# Patient Record
Sex: Male | Born: 1980 | Hispanic: Yes | Smoking: Never smoker
Health system: Southern US, Community
[De-identification: ages and names within clinical notes are randomized; demographics above are authoritative.]

## PROBLEM LIST (undated history)

## (undated) DIAGNOSIS — G510 Bell's palsy: Secondary | ICD-10-CM

## (undated) HISTORY — DX: Bell's palsy: G51.0

---

## 2012-10-12 ENCOUNTER — Ambulatory Visit (INDEPENDENT_AMBULATORY_CARE_PROVIDER_SITE_OTHER): Payer: BC Managed Care – PPO | Admitting: Emergency Medicine

## 2012-10-12 VITALS — BP 132/82 | HR 52 | Temp 98.0°F | Resp 18 | Ht 62.0 in | Wt 167.0 lb

## 2012-10-12 DIAGNOSIS — H1130 Conjunctival hemorrhage, unspecified eye: Secondary | ICD-10-CM

## 2012-10-12 DIAGNOSIS — H1131 Conjunctival hemorrhage, right eye: Secondary | ICD-10-CM

## 2012-10-12 DIAGNOSIS — B351 Tinea unguium: Secondary | ICD-10-CM

## 2012-10-12 MED ORDER — OFLOXACIN 0.3 % OP SOLN: 1.0000 [drp] | Freq: Four times a day (QID) | OPHTHALMIC | Status: DC

## 2012-10-12 MED ORDER — TERBINAFINE HCL 250 MG PO TABS: 250.0000 mg | ORAL_TABLET | Freq: Every day | ORAL | Status: DC

## 2012-10-12 NOTE — Patient Instructions (Addendum)
Ringworm, Nail A fungal infection of the nail (tinea unguium/onychomycosis) is common. It is common as the visible part of the nail is composed of dead cells which have no blood supply to help prevent infection. It occurs because fungi are everywhere and will pick any opportunity to grow on any dead material. Because nails are very slow growing they require up to 2 years of treatment with anti-fungal medications. The entire nail back to the base is infected. This includes approximately  of the nail which you cannot see. If your caregiver has prescribed a medication by mouth, take it every day and as directed. No progress will be seen for at least 6 to 9 months. Do not be disappointed! Because fungi live on dead cells with little or no exposure to blood supply, medication delivery to the infection is slow; thus the cure is slow. It is also why you can observe no progress in the first 6 months. The nail becoming cured is the base of the nail, as it has the blood supply. Topical medication such as creams and ointments are usually not effective. Important in successful treatment of nail fungus is closely following the medication regimen that your doctor prescribes. Sometimes you and your caregiver may elect to speed up this process by surgical removal of all the nails. Even this may still require 6 to 9 months of additional oral medications. See your caregiver as directed. Remember there will be no visible improvement for at least 6 months. See your caregiver sooner if other signs of infection (redness and swelling) develop. Document Released: 04/26/2000 Document Revised: 07/22/2011 Document Reviewed: 07/05/2008 ExitCare Patient Information 2014 [employer].  

## 2012-10-12 NOTE — Progress Notes (Signed)
[facility] [address] [place] [address] [phone]  Date:  10/12/2012   Name:  Christian Collins   DOB:  1981-04-25   MRN:  [id number]  PCP:  No PCP Per Patient    Chief Complaint: Eye Problem and Nail Problem   History of Present Illness:  Christian Collins is a 32 y.o. very pleasant male patient who presents with the following:  Injured while cutting metal and has a foreign body sensation in right eye and associated pain.  Accident occurred Wednesday.  No improvement with over the counter medications or other home remedies. Has deformity and pain in both great toenails.  No history of injury.   Denies other complaint or health concern today.   There are no active problems to display for this patient.   History reviewed. No pertinent past medical history.  History reviewed. No pertinent past surgical history.  History  Substance Use Topics  . Smoking status: Never Smoker   . Smokeless tobacco: Not on file  . Alcohol Use: Yes    History reviewed. No pertinent family history.  No Known Allergies  Medication list has been reviewed and updated.  No current outpatient prescriptions on file prior to visit.   No current facility-administered medications on file prior to visit.    Review of Systems:  As per HPI, otherwise negative.    Physical Examination: Filed Vitals:   10/12/12 1558  BP: 132/82  Pulse: 52  Temp: 98 F (36.7 C)  Resp: 18   Filed Vitals:   10/12/12 1558  Height: 5\' 2"  (1.575 m)  Weight: 167 lb (75.751 kg)   Body mass index is 30.54 kg/(m^2). Ideal Body Weight: Weight in (lb) to have BMI = 25: 136.4   GEN: WDWN, NAD, Non-toxic, Alert & Oriented x 3 HEENT: Atraumatic, Normocephalic.  Ears and Nose: No external deformity. EXTR: No clubbing/cyanosis/edema NEURO: Normal gait.  PSYCH: Normally interactive. Conversant. Not depressed or anxious appearing.  Calm demeanor.  RIGHT Eye:  PRRERLA EOMI no foreign body.   Has small lateral conjunctival hemorrhage. No fluorescein uptake. Feet:  Onychomycosis great toe nails   Assessment and Plan: Conjunctival hemorrhage Ocuflox Onychomycosis lamisil To follow up monthly for LFT's   Signed,  Phillips Odor, MD

## 2014-08-23 ENCOUNTER — Ambulatory Visit (INDEPENDENT_AMBULATORY_CARE_PROVIDER_SITE_OTHER): Payer: BLUE CROSS/BLUE SHIELD | Admitting: Urgent Care

## 2014-08-23 ENCOUNTER — Telehealth: Payer: Self-pay | Admitting: *Deleted

## 2014-08-23 VITALS — BP 130/82 | HR 62 | Temp 98.0°F | Resp 17 | Ht 62.25 in | Wt 175.2 lb

## 2014-08-23 DIAGNOSIS — R29810 Facial weakness: Secondary | ICD-10-CM | POA: Diagnosis not present

## 2014-08-23 DIAGNOSIS — G510 Bell's palsy: Secondary | ICD-10-CM

## 2014-08-23 DIAGNOSIS — H02402 Unspecified ptosis of left eyelid: Secondary | ICD-10-CM

## 2014-08-23 DIAGNOSIS — R519 Headache, unspecified: Secondary | ICD-10-CM

## 2014-08-23 DIAGNOSIS — R51 Headache: Secondary | ICD-10-CM

## 2014-08-23 DIAGNOSIS — B353 Tinea pedis: Secondary | ICD-10-CM

## 2014-08-23 LAB — POCT CBC
GRANULOCYTE PERCENT: 68.8 % (ref 37–80)
HCT, POC: 46.3 % (ref 43.5–53.7)
HEMOGLOBIN: 15.3 g/dL (ref 14.1–18.1)
Lymph, poc: 1.2 (ref 0.6–3.4)
MCH, POC: 27.9 pg (ref 27–31.2)
MCHC: 33 g/dL (ref 31.8–35.4)
MCV: 84.8 fL (ref 80–97)
MID (cbc): 0.3 (ref 0–0.9)
MPV: 7.4 fL (ref 0–99.8)
POC GRANULOCYTE: 3.3 (ref 2–6.9)
POC LYMPH PERCENT: 25 %L (ref 10–50)
POC MID %: 6.2 % (ref 0–12)
Platelet Count, POC: 227 10*3/uL (ref 142–424)
RBC: 5.46 M/uL (ref 4.69–6.13)
RDW, POC: 12.2 %
WBC: 4.8 10*3/uL (ref 4.6–10.2)

## 2014-08-23 LAB — GLUCOSE, POCT (MANUAL RESULT ENTRY): POC GLUCOSE: 84 mg/dL (ref 70–99)

## 2014-08-23 MED ORDER — PREDNISONE 20 MG PO TABS
20.0000 mg | ORAL_TABLET | Freq: Every day | ORAL | Status: DC
Start: 2014-08-23 — End: 2016-09-11

## 2014-08-23 MED ORDER — TERBINAFINE HCL 250 MG PO TABS: 250.0000 mg | ORAL_TABLET | Freq: Every day | ORAL | Status: DC

## 2014-08-23 NOTE — Telephone Encounter (Signed)
Pt was called and advised/instructed on where to go for his 10 am CT [facility]  Pt was advised to report to the main entrance and ask for Radiology.  Pt understood and was also advised to call [facility] if he had any issues or problems.

## 2014-08-23 NOTE — Progress Notes (Signed)
MRN: [id number] DOB: Aug 21, 1980  Subjective:   Christian Collins is a 34 y.o. male presenting for chief complaint of Facial Swelling and Numbness  Reports 3 day history of left sided scalp and facial pain lasting ~5 minutes, followed by drooping, numbness of same side. Patient also developed right-sided facial pain and numbness but without the drooping yesterday. Today reports ongoing left eye pain/discomfort, photophobia, eye twitching, eye drooping, neck stiffness, feels like his throat has been closing when he lies down, thinks it becomes inflamed and has transient difficulty breathing. Has tried Neurobion (Vitamin B) without any relief. Denies fevers, slurred speech, headache, confusion, disorientation, rashes, ear pain, ear drainage, eye drainage, cough, chest pain, shob, wheezing, n/v, abdominal pain, incontinence. Denies recent respiratory illness. States that he is generally healthy. Of note, he states his mother has history of diabetes, has had similar episode of facial drooping but is unclear about what her diagnosis and treatment was. Denies smoking, has occasional alcohol drink.   Foot issue - reports history of athlete's foot. States that he now has this problem in his left foot over his left big toe, states it's very itchy and chipping away at his nail. Denies fevers, drainage of pus or blood, erythema, joint pain. Has previously tried oral Lamisil with significant improvement in athlete's foot in right foot.   Denies any other aggravating or relieving factors, no other questions or concerns.  Daquon has a current medication list which includes the following prescription(s): ofloxacin and terbinafine. He has No Known Allergies.  Frederic  has no past medical history on file. Also  has no past surgical history on file.  ROS As in subjective.  Objective:   Vitals: BP 130/82 mmHg  Pulse 62  Temp(Src) 98 F (36.7 C) (Oral)  Resp 17  Ht 5' 2.25" (1.581 m)  Wt 175 lb 3.2 oz  (79.47 kg)  BMI 31.79 kg/m2  SpO2 98%  Physical Exam  Constitutional: He is oriented to person, place, and time and well-developed, well-nourished, and in no distress.  HENT:  TM's intact bilaterally, no effusions or erythema. Nares patent, nasal turbinates pink and moist. No sinus tenderness. Oropharynx clear, mucous membranes moist, dentition in good repair. Left-sided mouth with slight droop.  Eyes: Conjunctivae and EOM are normal. Pupils are equal, round, and reactive to light. Right eye exhibits no discharge. Left eye exhibits no discharge. No scleral icterus.  Left eyelid slightly closed and drooping.  Neck: No thyromegaly present.  Negative Kernig and Brudzinski.  Cardiovascular: Normal rate, regular rhythm and intact distal pulses.  Exam reveals no gallop and no friction rub.   No murmur heard. Pulmonary/Chest: No stridor. No respiratory distress. He has no wheezes. He has no rales. He exhibits no tenderness.  Abdominal: Soft. Bowel sounds are normal. He exhibits no distension and no mass. There is no tenderness.  Musculoskeletal: Normal range of motion. He exhibits no edema or tenderness.  Lymphadenopathy:    He has no cervical adenopathy.  Neurological: He is alert and oriented to person, place, and time. He has normal reflexes. A cranial nerve deficit (slight weakness in right eyelid closure, all other CN without deficit) is present.  Skin: Skin is warm and dry. Rash (first left toe with ) noted. No erythema. No pallor.   Results for orders placed or performed in visit on 08/23/14 (from the past 24 hour(s))  POCT CBC     Status: None   Collection Time: 08/23/14  6:11 PM  Result Value Ref Range  WBC 4.8 4.6 - 10.2 K/uL   Lymph, poc 1.2 0.6 - 3.4   POC LYMPH PERCENT 25.0 10 - 50 %L   MID (cbc) 0.3 0 - 0.9   POC MID % 6.2 0 - 12 %M   POC Granulocyte 3.3 2 - 6.9   Granulocyte percent 68.8 37 - 80 %G   RBC 5.46 4.69 - 6.13 M/uL   Hemoglobin 15.3 14.1 - 18.1 g/dL   HCT,  POC 40.9 81.1 - 53.7 %   MCV 84.8 80 - 97 fL   MCH, POC 27.9 27 - 31.2 pg   MCHC 33.0 31.8 - 35.4 g/dL   RDW, POC 91.4 %   Platelet Count, POC 227 142 - 424 K/uL   MPV 7.4 0 - 99.8 fL  POCT glucose (manual entry)     Status: None   Collection Time: 08/23/14  6:11 PM  Result Value Ref Range   POC Glucose 84 70 - 99 mg/dl   Assessment and Plan :   1. Facial pain 2. Drooping eyelid, left 3. Drooping of mouth 4. Bell's palsy - Start prednisone taper, reviewed this with patient, will schedule non-contrast head CT to evaluate for possible mass. Follow up with head CT results thereafter. Recommended recheck in 1 month.  5. Tinea pedis of left foot - Start oral lamisil, advised patient to return in 30 days for repeat   Wallis Bamberg, PA-C [facility] [phone] 08/23/2014 5:16 PM

## 2014-08-23 NOTE — Patient Instructions (Signed)
Parálisis de Bell °(Bell's Palsy) °La parálisis de Bell es un trastorno en el que los músculos de un lado de la cara no pueden moverse (parálisis). Esto se debe a que los nervios de la cara están paralizados. Se cree que es producido por un virus. El virus causa inflamación del nervio que controla los movimientos de un lado de la cara. El nervio pasa a través de un espacio angosto, rodeado de hueso. Cuando el nervio se inflama, el hueso puede comprimirlo. Esto da como resultado un daño a la cubierta protectora que rodea el nervio. Este daño interfiere con la forma en la que el nervio se comunica con los músculos de la cara. Como resultado, puede causar debilidad o parálisis en los músculos faciales.  °Las lesiones (traumatismos), tumores y cirugía pueden causar parálisis de Bell, pero la mayoría de las veces se desconoce la causa. Es un trastorno bastante frecuente. Comienza de repente (aparición abrupta) y la parálisis por lo general desaparece en 2 días. La parálisis de Bell no es peligrosa. Pero como el ojo no se cierra adecuadamente, necesitará tomar algunos recaudos para que no se seque. Esto puede incluir un vendaje (mantener el ojo cerrado) o humedecerlo con lágrimas artificiales. Rara vez ocurre en ambos lados al mismo tiempo. °SÍNTOMAS °· Ceja caída. °· Caída del ojo y comisura de la boca. °· Incapacidad para cerrar un ojo. °· Pérdida del sentido del gusto en la parte anterior de la lengua. °· Sensibilidad a ruidos fuertes. °TRATAMIENTO °El tratamiento por lo general no es quirúrgico. Si el paciente fue atendido dentro de las primeras 24 a 48 horas puede que se le haya prescrito un tratamiento corto con esteroides para intentar acortar el curso de la enfermedad. También se podrán utilizar medicamentos antivirales junto con los esteroides, pero no está claro si son útiles.  °Necesitará proteger el ojo si no puede cerrarlo. La córnea (cubierta transparente del ojo) podría secarse y dañarse. Se podrán utilizar  lágrimas artificiales para mantener el ojo lubricado. Se deberán utilizar lentes o parches para proteger el ojo. °PRONÓSTICO °El tiempo de recuperación es variable y puede tardar desde algunos días a varios meses. Aunque en general se cura completamente, (en un 80% de los casos aproximadamente), las consecuencias no pueden predecirse. La mayoría de las personas mejoran dentro de las 3 semanas del comienzo de los síntomas. Las mejoras deberán continuar por entre 3 y 6 meses. Una pequeña cantidad de personas presentan debilidad moderada a grave que es permanente.  °INSTRUCCIONES PARA EL CUIDADO DOMICILIARIO °· Si su médico le prescribe medicamentos para controlar la inflamación del nervio, tómela de la manera indicada. No suspenda los medicamentos a menos que se lo haya indicado el profesional que le asiste. °· Utilice gotas oculares para humedecer los ojos y prevenir la sequedad, de la manera en que se le indique. °· Proteja sus ojos de la manera en que se lo indique el profesional que le asiste. °· Practique masajes faciales y ejercicios de la manera que se lo indique el profesional que le asiste. °· Realice sus actividades normales y procure un descanso regular. °SOLICITE ATENCIÓN MÉDICA DE INMEDIATO SI: °· Presenta dolor, enrojecimiento o irritación en el ojo. °· Usted o su niño tienen una temperatura oral de más de 38,9° C (102° F) y no puede controlarla con medicamentos. °ESTÉ SEGURO QUE:  °· Comprende las instrucciones para el alta médica. °· Controlará su enfermedad. °· Solicitará atención médica de inmediato según las indicaciones. °Document Released: 04/29/2005 Document Revised: 07/22/2011 °ExitCare® Patient   Information ©2015 [employer]. This information is not intended to replace advice given to you by your health care provider. Make sure you discuss any questions you have with your health care provider. ° °

## 2014-08-24 ENCOUNTER — Encounter (HOSPITAL_COMMUNITY): Payer: Self-pay

## 2014-08-24 ENCOUNTER — Ambulatory Visit (HOSPITAL_COMMUNITY)
Admission: RE | Admit: 2014-08-24 | Discharge: 2014-08-24 | Disposition: A | Discharge status: Home / Self Care | Payer: BLUE CROSS/BLUE SHIELD | Source: Ambulatory Visit | Attending: Urgent Care | Admitting: Urgent Care

## 2014-08-24 DIAGNOSIS — G510 Bell's palsy: Secondary | ICD-10-CM

## 2014-08-24 DIAGNOSIS — R51 Headache: Secondary | ICD-10-CM | POA: Insufficient documentation

## 2014-08-24 DIAGNOSIS — R519 Headache, unspecified: Secondary | ICD-10-CM

## 2014-08-24 DIAGNOSIS — H02402 Unspecified ptosis of left eyelid: Secondary | ICD-10-CM

## 2014-08-24 DIAGNOSIS — R29810 Facial weakness: Secondary | ICD-10-CM

## 2014-08-24 LAB — COMPREHENSIVE METABOLIC PANEL
ALT: 47 U/L (ref 0–53)
AST: 26 U/L (ref 0–37)
Albumin: 4.4 g/dL (ref 3.5–5.2)
Alkaline Phosphatase: 80 U/L (ref 39–117)
BUN: 10 mg/dL (ref 6–23)
CALCIUM: 9 mg/dL (ref 8.4–10.5)
CHLORIDE: 106 meq/L (ref 96–112)
CO2: 26 mEq/L (ref 19–32)
CREATININE: 0.66 mg/dL (ref 0.50–1.35)
Glucose, Bld: 86 mg/dL (ref 70–99)
POTASSIUM: 3.9 meq/L (ref 3.5–5.3)
Sodium: 142 mEq/L (ref 135–145)
Total Bilirubin: 0.6 mg/dL (ref 0.2–1.2)
Total Protein: 6.8 g/dL (ref 6.0–8.3)

## 2014-08-25 NOTE — Telephone Encounter (Signed)
Called patient at [phone] to report his results. Head CT and Cmet were normal. He states that he feels much better. Advised to continue current treatment plan. Return to clinic in 1 month.  Wallis BambergMario Domnique Vanegas, PA-C [facility] [phone] 08/25/2014  10:09 AM

## 2016-09-11 ENCOUNTER — Ambulatory Visit (INDEPENDENT_AMBULATORY_CARE_PROVIDER_SITE_OTHER): Payer: BLUE CROSS/BLUE SHIELD | Admitting: Urgent Care

## 2016-09-11 ENCOUNTER — Encounter: Payer: Self-pay | Admitting: Urgent Care

## 2016-09-11 VITALS — BP 144/82 | HR 83 | Temp 98.1°F | Resp 17 | Ht 61.5 in | Wt 168.0 lb

## 2016-09-11 DIAGNOSIS — R142 Eructation: Secondary | ICD-10-CM | POA: Diagnosis not present

## 2016-09-11 DIAGNOSIS — R0789 Other chest pain: Secondary | ICD-10-CM | POA: Diagnosis not present

## 2016-09-11 MED ORDER — ESOMEPRAZOLE MAGNESIUM 20 MG PO CPDR: 20.0000 mg | DELAYED_RELEASE_CAPSULE | Freq: Every day | ORAL | 2 refills | Status: AC

## 2016-09-11 MED ORDER — RANITIDINE HCL 150 MG PO TABS: 150.0000 mg | ORAL_TABLET | Freq: Two times a day (BID) | ORAL | 0 refills | Status: AC

## 2016-09-11 NOTE — Progress Notes (Addendum)
    MRN: [id number] DOB: Sep 14, 1980  Subjective:   Christian Collins is a 36 y.o. male presenting for follow up on Bell's palsy. Last OV was 08/23/2014. Head CT was negative at the time. Patient was placed on short steroid course and recommended to follow up in 1 month following his visit. Today, patient reports significant improvement in his symptoms. However, he has had 2 month history of intermittent left-sided sharp chest pain. His chest pain is transient, non-radiating, not associated with food. Denies fever, heart racing, palpitations, shob, diaphoresis, does not go into neck, jaw or limb. Denies smoking cigarettes. Drinks 1 alcohol drink per week. Drinks [employer] 4 times per week, has 3 cups of coffee per week. Drinks 48 ounces of water daily. Patient does not eat healthily, eats a lot of fast food, greasy and fried foods. Does not eat much fiber.  Arvo is not currently taking any medications. Also has No Known Allergies. [name]  has a past medical history of Bell's palsy. Also denies past surgical history. Family history is positive for diabetes with his mother.  Objective:   Vitals: BP (!) 144/82 (BP Location: Right Arm, Patient Position: Sitting, Cuff Size: Normal)   Pulse 83   Temp 98.1 F (36.7 C) (Oral)   Resp 17   Ht 5' 1.5" (1.562 m)   Wt 168 lb (76.2 kg)   SpO2 98%   BMI 31.23 kg/m   Physical Exam  Constitutional: He is oriented to person, place, and time. He appears well-developed and well-nourished.  HENT:  Mouth/Throat: Oropharynx is clear and moist.  Eyes: No scleral icterus.  Neck: Normal range of motion. Neck supple.  Cardiovascular: Normal rate, regular rhythm and intact distal pulses.  Exam reveals no gallop and no friction rub.   No murmur heard. Pulmonary/Chest: No respiratory distress. He has no wheezes. He has no rales.  Abdominal: Soft. Bowel sounds are normal. He exhibits no distension and no mass. There is no tenderness. There is no guarding.    Neurological: He is alert and oriented to person, place, and time.  Skin: Skin is warm and dry.   ECG interpretation - normal sinus rhythm at 62bpm.  Assessment and Plan :   1. Atypical chest pain 2. Burping - Labs pending, will start management for GERD. Recheck in 4 weeks.  Wallis Bamberg, PA-C [facility] [phone] 09/11/2016 3:45 PM

## 2016-09-11 NOTE — Patient Instructions (Addendum)
Opciones de alimentos para pacientes con reflujo gastroesofgico - Adultos (Food Choices for Gastroesophageal Reflux Disease, Adult) Cuando se tiene reflujo gastroesofgico (ERGE), los alimentos que se ingieren y los hbitos de alimentacin son muy importantes. Elegir los alimentos adecuados puede ayudar a aliviar las molestias ocasionadas por el ERGE. QU PAUTAS GENERALES DEBO SEGUIR?  Elija las frutas, los vegetales, los cereales integrales, los productos lcteos, la carne de vaca, de pescado y de ave con bajo contenido de grasas.  Limite las grasas, como los aceites, los aderezos para ensalada, la manteca, los frutos secos y el aguacate.  Lleve un registro de las comidas para identificar los alimentos que ocasionan sntomas.  Evite los alimentos que le ocasionen reflujo. Pueden ser distintos para cada persona.  Haga comidas pequeas con frecuencia en lugar de tres comidas abundantes todos los das.  Coma lentamente, en un clima distendido.  Limite el consumo de alimentos fritos.  Cocine los alimentos utilizando mtodos que no sean la fritura.  Evite el consumo alcohol.  Evite beber grandes cantidades de lquidos con las comidas.  Evite agacharse o recostarse hasta despus de 2 o 3horas de haber comido. QU ALIMENTOS NO SE RECOMIENDAN? Los siguientes son algunos alimentos y bebidas que pueden empeorar los sntomas: Vegetales  Tomates. Jugo de tomate. Salsa de tomate y espagueti. Ajes. Cebolla y ajo. Rbano picante. Frutas  Naranjas, pomelos y limn (fruta y jugo). Carnes  Carnes de vaca, de pescado y de ave con gran contenido de grasas. Esto incluye los perros calientes, las costillas, el jamn, la salchicha, el salame y el tocino. Lcteos  Leche entera y leche chocolatada. Crema cida. Crema. Mantequilla. Helados. Queso crema. Bebidas  Caf y t negro, con o sin cafena Bebidas gaseosas o energizantes. Condimentos  Salsa picante. Salsa barbacoa. Dulces/postres   Chocolate y cacao. Rosquillas. Menta y mentol. Grasas y aceites  Alimentos con alto contenido de grasas, incluidas las papas fritas. Otros  Vinagre. Especias picantes, como la pimienta negra, la pimienta blanca, la pimienta roja, la pimienta de cayena, el curry en polvo, los clavos de olor, el jengibre y el chile en polvo. Los artculos mencionados arriba pueden no ser una lista completa de las bebidas y los alimentos que se deben evitar. Comunquese con el nutricionista para recibir ms informacin.  Esta informacin no tiene como fin reemplazar el consejo del mdico. Asegrese de hacerle al mdico cualquier pregunta que tenga. Document Released: 02/06/2005 Document Revised: 05/20/2014 Document Reviewed: 03/03/2013 Elsevier Interactive Patient Education  2017 Elsevier Inc.     IF you received an x-ray today, you will receive an invoice from [facility]. Please contact [facility] at [phone] with questions or concerns regarding your invoice.   IF you received labwork today, you will receive an invoice from [facility]. Please contact [facility] at [phone] with questions or concerns regarding your invoice.   Our billing staff will not be able to assist you with questions regarding bills from these companies.  You will be contacted with the lab results as soon as they are available. The fastest way to get your results is to activate your My Chart account. Instructions are located on the last page of this paperwork. If you have not heard from us regarding the results in 2 weeks, please contact this office.      

## 2016-09-12 ENCOUNTER — Other Ambulatory Visit: Payer: Self-pay | Admitting: Urgent Care

## 2016-09-12 DIAGNOSIS — A048 Other specified bacterial intestinal infections: Secondary | ICD-10-CM

## 2016-09-12 LAB — COMPREHENSIVE METABOLIC PANEL
ALBUMIN: 5.1 g/dL (ref 3.5–5.5)
ALT: 59 IU/L — ABNORMAL HIGH (ref 0–44)
AST: 33 IU/L (ref 0–40)
Albumin/Globulin Ratio: 2 (ref 1.2–2.2)
Alkaline Phosphatase: 78 IU/L (ref 39–117)
BUN / CREAT RATIO: 14 (ref 9–20)
BUN: 13 mg/dL (ref 6–20)
Bilirubin Total: 0.8 mg/dL (ref 0.0–1.2)
CALCIUM: 9.8 mg/dL (ref 8.7–10.2)
CO2: 25 mmol/L (ref 18–29)
CREATININE: 0.92 mg/dL (ref 0.76–1.27)
Chloride: 99 mmol/L (ref 96–106)
GFR, EST AFRICAN AMERICAN: 125 mL/min/{1.73_m2} (ref >59)
GFR, EST NON AFRICAN AMERICAN: 108 mL/min/{1.73_m2} (ref >59)
GLUCOSE: 75 mg/dL (ref 65–99)
Globulin, Total: 2.5 g/dL (ref 1.5–4.5)
Potassium: 3.9 mmol/L (ref 3.5–5.2)
Sodium: 139 mmol/L (ref 134–144)
TOTAL PROTEIN: 7.6 g/dL (ref 6.0–8.5)

## 2016-09-12 LAB — CBC
Hematocrit: 47.1 % (ref 37.5–51.0)
Hemoglobin: 16.5 g/dL (ref 13.0–17.7)
MCH: 29 pg (ref 26.6–33.0)
MCHC: 35 g/dL (ref 31.5–35.7)
MCV: 83 fL (ref 79–97)
Platelets: 194 10*3/uL (ref 150–379)
RBC: 5.68 x10E6/uL (ref 4.14–5.80)
RDW: 13.5 % (ref 12.3–15.4)
WBC: 5.4 10*3/uL (ref 3.4–10.8)

## 2016-09-12 LAB — LIPID PANEL
CHOLESTEROL TOTAL: 220 mg/dL — AB (ref 100–199)
Chol/HDL Ratio: 4.2 ratio (ref 0.0–5.0)
HDL: 52 mg/dL (ref >39)
LDL CALC: 134 mg/dL — AB (ref 0–99)
Triglycerides: 171 mg/dL — ABNORMAL HIGH (ref 0–149)
VLDL CHOLESTEROL CAL: 34 mg/dL (ref 5–40)

## 2016-09-12 LAB — H. PYLORI BREATH TEST: H. PYLORI UBIT: POSITIVE — AB

## 2016-09-12 MED ORDER — AMOXICILLIN 500 MG PO CAPS: 1000.0000 mg | ORAL_CAPSULE | Freq: Two times a day (BID) | ORAL | 0 refills | Status: DC

## 2016-09-12 MED ORDER — CLARITHROMYCIN ER 500 MG PO TB24: 1000.0000 mg | ORAL_TABLET | Freq: Every day | ORAL | 0 refills | Status: AC

## 2016-09-30 IMAGING — CT CT HEAD W/O CM
1 series · 16 of 30 positions shown, 20 images · non-contrast
Comparison: None.

CLINICAL DATA: Left facial tightness, left high true, right facial
numbness

EXAM:
CT HEAD WITHOUT CONTRAST
TECHNIQUE: Contiguous axial images were obtained from the base of the skull
through the vertex without intravenous contrast.

[Series 2: headseq 4.8 h45s · axial · 0.43mm/px · z∈[-78,+53]mm · 16 of 30 slices shown, 20 images]
[im 2/30  brain]
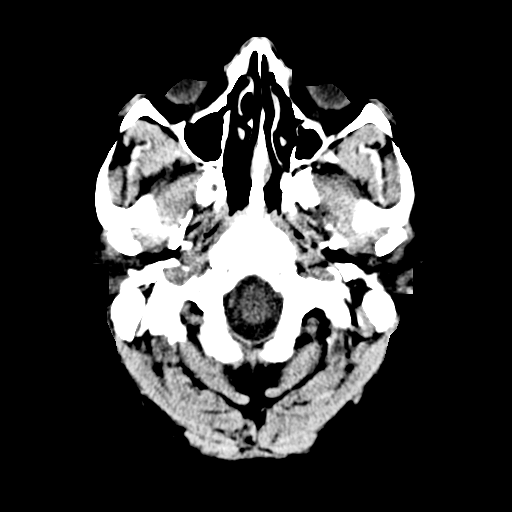
[im 2/30  bone]
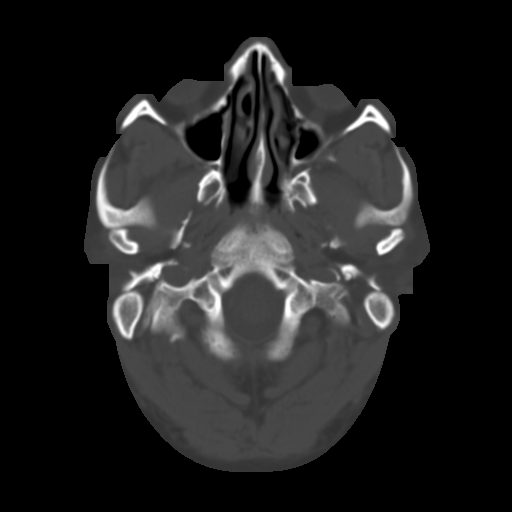
[im 4/30  brain]
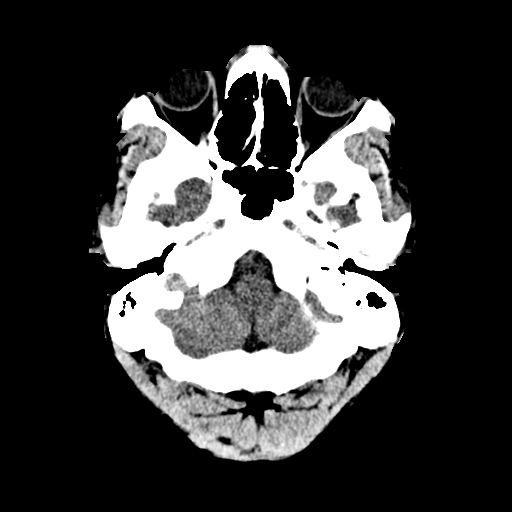
[im 6/30  brain]
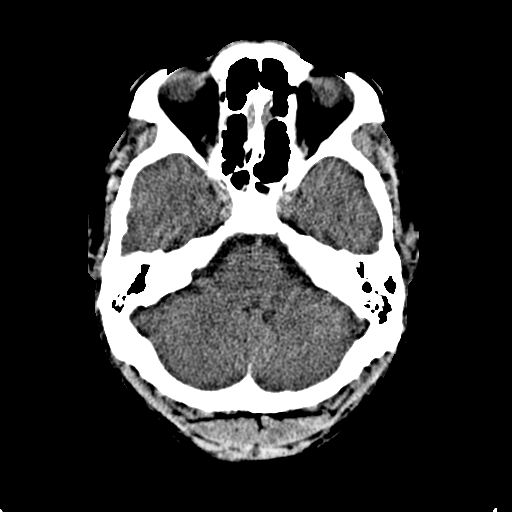
[im 8/30  brain]
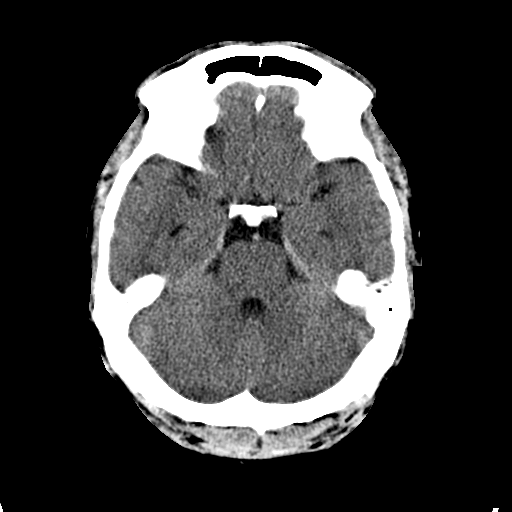
[im 9/30  brain]
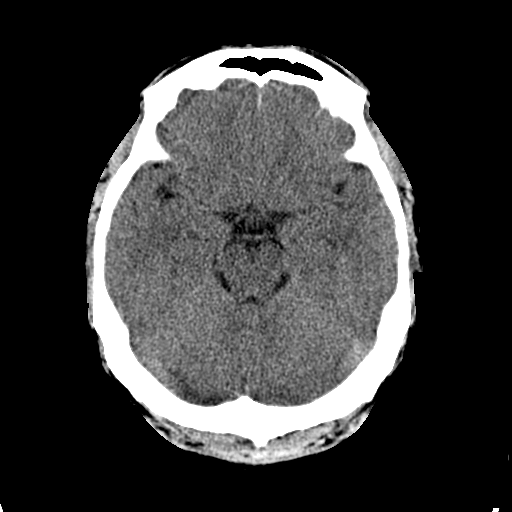
[im 9/30  bone]
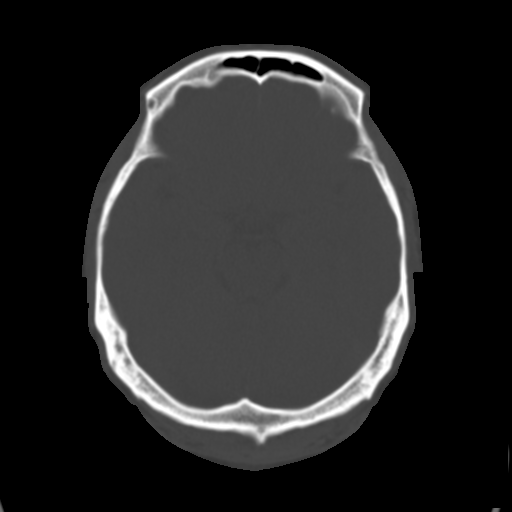
[im 11/30  brain]
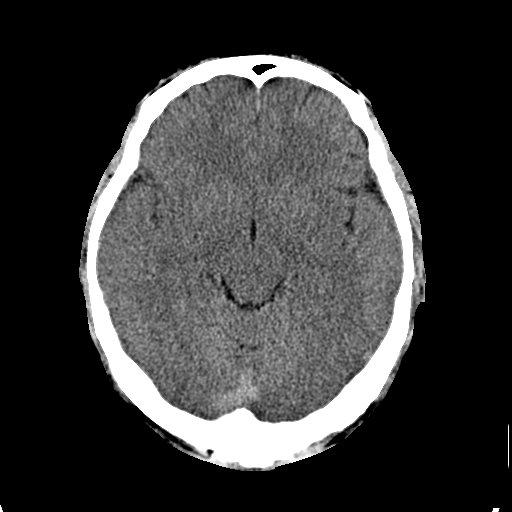
[im 13/30  brain]
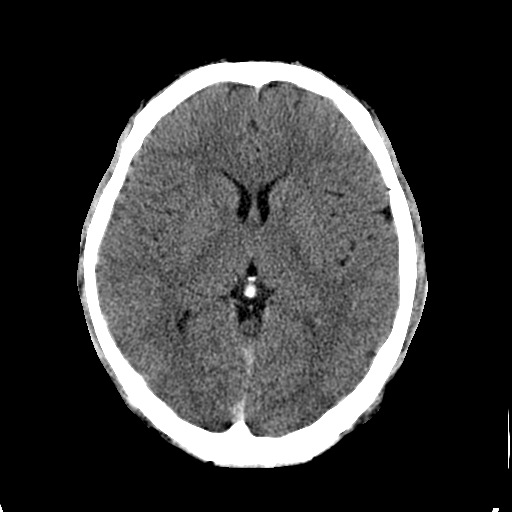
[im 15/30  brain]
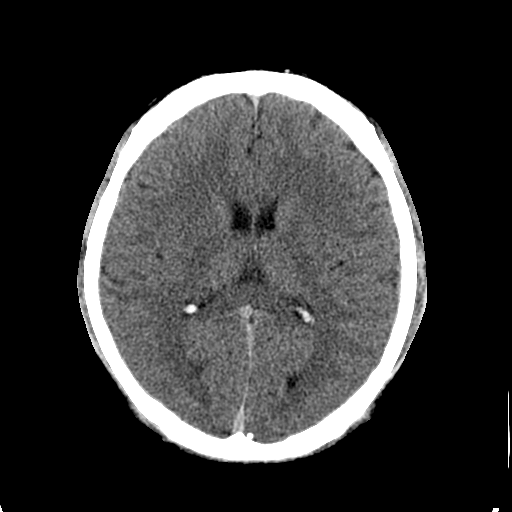
[im 16/30  brain]
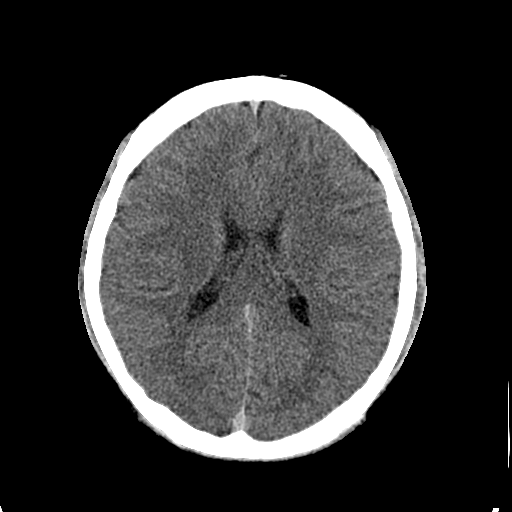
[im 16/30  bone]
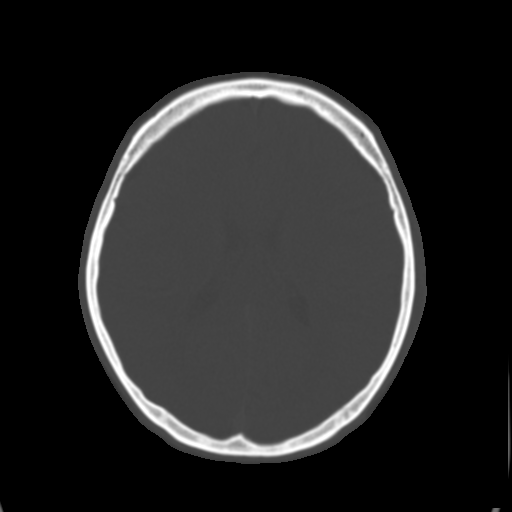
[im 18/30  brain]
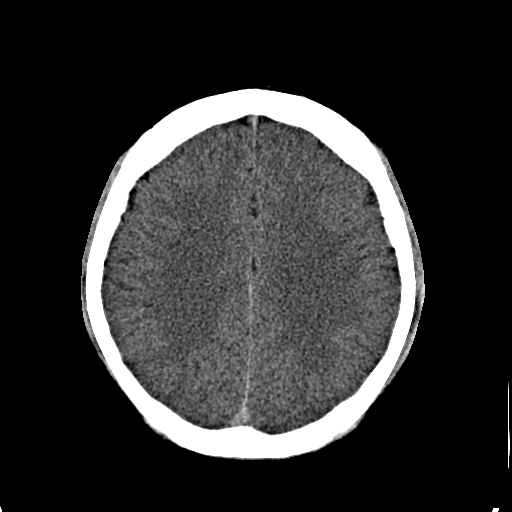
[im 20/30  brain]
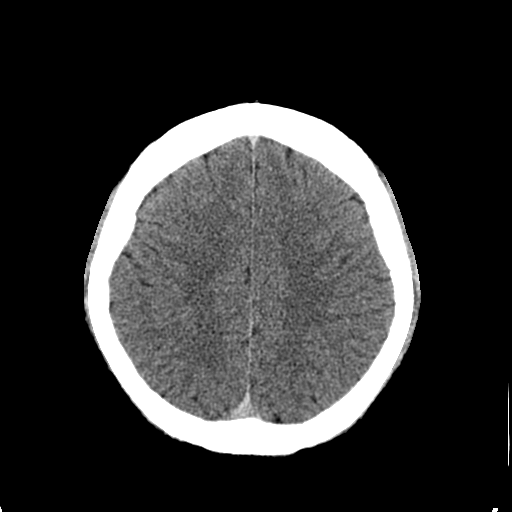
[im 22/30  brain]
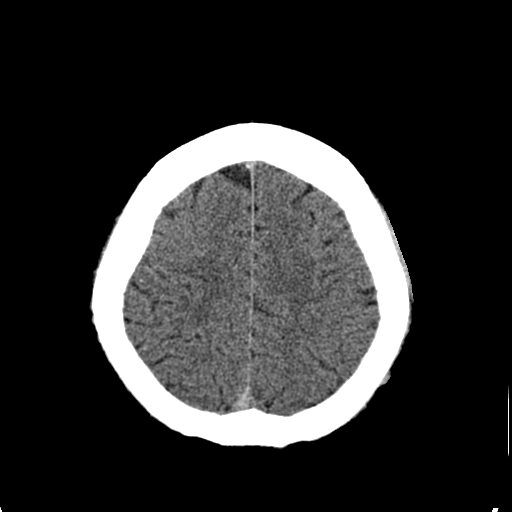
[im 23/30  brain]
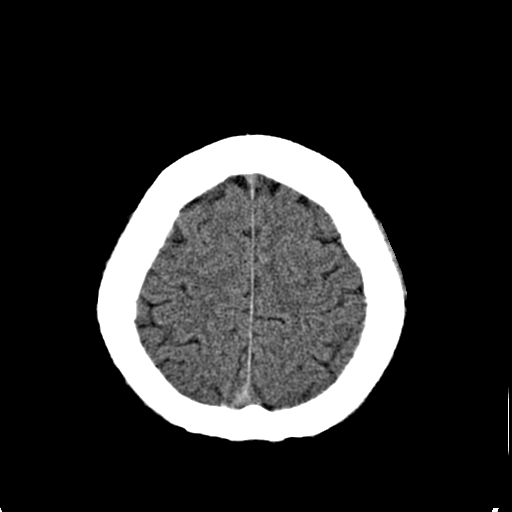
[im 23/30  bone]
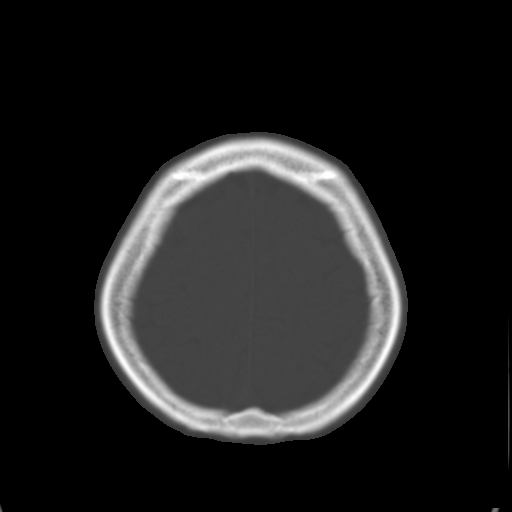
[im 25/30  brain]
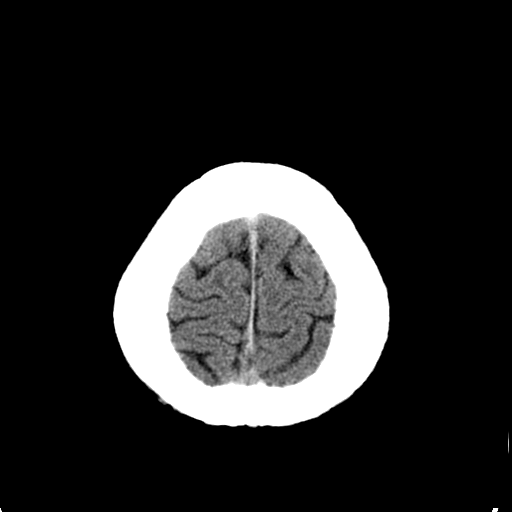
[im 27/30  brain]
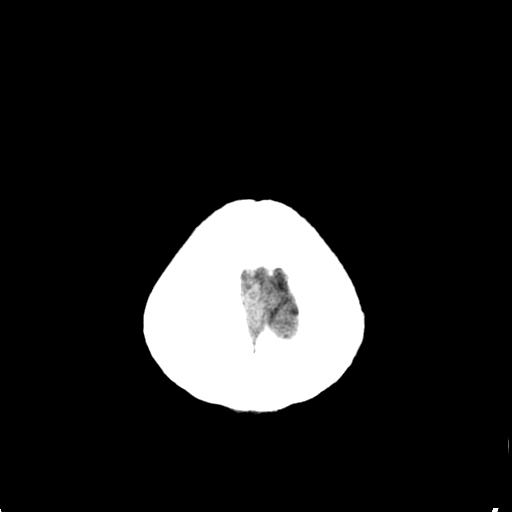
[im 29/30  brain]
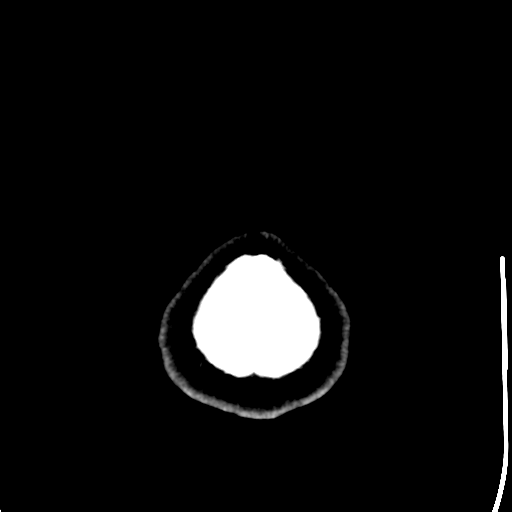

[16 of 30 positions shown; findings below may reference images not displayed]

FINDINGS: The ventricular system is normal in size and configuration, and the
septum is in a normal midline position. The fourth ventricle and
basilar cisterns are unremarkable. No hemorrhage, mass lesion, or
acute infarction is seen. On bone window images, no calvarial
abnormality is seen. No sinusitis is noted. Minimal mucosa
irregularity is noted within the sphenoid sinus.
IMPRESSION: 1. No acute intracranial abnormality.
2. Minimal mucosal irregularity in the sphenoid sinus.

## 2018-02-27 DIAGNOSIS — R070 Pain in throat: Secondary | ICD-10-CM | POA: Diagnosis not present

## 2018-02-27 DIAGNOSIS — R0981 Nasal congestion: Secondary | ICD-10-CM | POA: Diagnosis not present

## 2018-05-01 ENCOUNTER — Ambulatory Visit: Payer: BLUE CROSS/BLUE SHIELD | Admitting: Emergency Medicine

## 2018-05-01 ENCOUNTER — Other Ambulatory Visit: Payer: Self-pay

## 2018-05-01 ENCOUNTER — Encounter: Payer: Self-pay | Admitting: Emergency Medicine

## 2018-05-01 VITALS — BP 123/83 | HR 75 | Temp 98.3°F | Resp 16 | Ht 61.75 in | Wt 185.2 lb

## 2018-05-01 DIAGNOSIS — Z23 Encounter for immunization: Secondary | ICD-10-CM

## 2018-05-01 DIAGNOSIS — K649 Unspecified hemorrhoids: Secondary | ICD-10-CM | POA: Diagnosis not present

## 2018-05-01 DIAGNOSIS — B351 Tinea unguium: Secondary | ICD-10-CM | POA: Diagnosis not present

## 2018-05-01 MED ORDER — TERBINAFINE HCL 250 MG PO TABS: 250.0000 mg | ORAL_TABLET | Freq: Every day | ORAL | 1 refills | Status: AC

## 2018-05-01 MED ORDER — HYDROCORTISONE 2.5 % RE CREA: 1.0000 "application " | TOPICAL_CREAM | Freq: Two times a day (BID) | RECTAL | 1 refills | Status: AC

## 2018-05-01 NOTE — Patient Instructions (Addendum)
   If you have lab work done today you will be contacted with your lab results within the next 2 weeks.  If you have not heard from us then please contact us. The fastest way to get your results is to register for My Chart.   IF you received an x-ray today, you will receive an invoice from [facility]. Please contact [facility] at [phone] with questions or concerns regarding your invoice.   IF you received labwork today, you will receive an invoice from [facility]. Please contact [facility] at [phone] with questions or concerns regarding your invoice.   Our billing staff will not be able to assist you with questions regarding bills from these companies.  You will be contacted with the lab results as soon as they are available. The fastest way to get your results is to activate your My Chart account. Instructions are located on the last page of this paperwork. If you have not heard from us regarding the results in 2 weeks, please contact this office.     Infeccin por hongos en las uas Fungal Nail Infection La infeccin por hongos en las uas es una infeccin frecuente de las uas de los pies o de las manos. Este trastorno afecta las uas de los pies con ms frecuencia que las uas de las manos. Generalmente afecta al dedo gordo del pie. Ms de una ua puede infectarse. Esta afeccin puede transmitirse de una persona a otra (es contagiosa). Cules son las causas? La causa de esta afeccin es un hongo. Son varios los tipos de hongos que pueden causar la infeccin. Estos hongos son frecuentes en las zonas hmedas y clidas. Si las manos o los pies entran en contacto con los hongos, se pueden introducir en una ruptura de las uas de las manos o de los pies y producir la infeccin. Qu incrementa el riesgo? Los siguientes factores pueden hacer que usted sea ms propenso a tener esta afeccin:  Ser hombre.  Ser una persona de edad avanzada.  Convivir con alguien  que tiene hongos.  Caminar descalzo en zonas donde proliferan hongos, como duchas o vestuarios.  Usar zapatos y calcetines que hacen transpirar los pies.  Tener una ua lastimada o haberse sometido a una ciruga de uas recientemente.  Tener ciertas afecciones, por ejemplo: ? Pie de atleta. ? Diabetes. ? Psoriasis. ? Mala circulacin. ? Debilitamiento del sistema de defensa del organismo (sistema inmunitario). Cules son los signos o los sntomas? Los sntomas de esta afeccin incluyen:  Una mancha plida sobre la ua.  Engrosamiento de la ua.  Una ua que se torna amarilla o marrn.  Bordes de las uas rugosos o quebradizos.  Una ua que se cae.  Una ua que se ha desprendido del lecho ungueal. Cmo se diagnostica? Esta afeccin se diagnostica mediante un examen fsico. El mdico podr tomar una muestra de la ua para examinarla y detectar si tiene hongos. Cmo se trata? No es necesario realizar tratamiento si la infeccin es leve. Si tiene cambios importantes en las uas, el tratamiento puede incluir lo siguiente:  Antimicticos que se toman por boca (va oral). Deber tomar los medicamentos durante algunas semanas o meses y no ver los resultados hasta despus de un largo tiempo. Estos medicamentos pueden tener efectos secundarios. Consulte al mdico sobre los problemas a los que debe estar atento.  Cremas o esmaltes de uas antimicticos. Se pueden usar junto con los medicamentos antimicticos que se administran por va oral.  Tratamiento lser de   las uas.  Ciruga para extirpar la ua. Esto puede ser [employer] casos ms graves de infecciones. La infeccin puede tardar un largo tiempo en desaparecer, habitualmente hasta un ao. Adems, la infeccin puede regresar. Siga estas indicaciones en su casa: Medicamentos  Tome o aplquese los medicamentos de venta libre y los recetados solamente como se lo haya indicado el mdico.  Consulte al mdico sobre el uso  de pomadas mentoladas para las uas de [place]. Cuidado de las uas  Crtese las uas con Psychologist, clinical.  Lvese y squese las manos y los pies todos [place].  Mantenga los pies secos: ? Use calcetines absorbentes y cmbiese los calcetines con frecuencia. ? Use un tipo de calzado que permita que el aire [place], como sandalias o zapatillas de lona. Deseche los zapatos viejos.  No use uas artificiales.  Si va al saln de esttica de uas, asegrese de elegir uno en el que se usen instrumentos limpios.  Aplquese polvo antimictico en los pies y en los zapatos. Indicaciones generales  No comparta elementos personales como toallas o cortauas.  No camine descalzo en duchas o vestuarios.  Use guantes de goma si est trabajando con sus manos en lugares mojados.  Concurra a todas las visitas de control como se lo haya indicado el mdico. Esto es importante. Comunquese con un mdico si: La infeccin no mejora o si empeora despus de varios meses. Resumen  La infeccin por hongos en las uas es una infeccin frecuente de las uas de los pies o de las manos.  No es necesario realizar tratamiento si la infeccin es leve. Si tiene [employer] uas, el tratamiento puede incluir la administracin de medicamentos por va oral y la aplicacin de un medicamento en las uas.  La infeccin puede tardar un largo tiempo en desaparecer, habitualmente hasta un ao. Adems, la infeccin puede regresar.  Tome o aplquese los medicamentos de venta libre y los recetados solamente como se lo haya indicado el mdico.  Siga las instrucciones de cuidado de las uas a fin de ayudar a Automotive engineer que la infeccin regrese o se extienda. Esta informacin no tiene Theme park manager el consejo del mdico. Asegrese de hacerle al mdico cualquier pregunta que tenga. Document Released: 02/06/2005 Document Revised: 11/06/2017 Document Reviewed: 11/06/2017 Elsevier Interactive Patient Education   2019 [employer].

## 2018-05-01 NOTE — Progress Notes (Signed)
Christian Collins 37 y.o.   Chief Complaint  Patient presents with  . Toe Pain    LEFT GREAT TOE x 8 months-nail    HISTORY OF PRESENT ILLNESS: This is a 37 y.o. male complaining of fungal toe infection on and off for the past 8 months. Also complaining of hemorrhoids.  HPI   Prior to Admission medications   Medication Sig Start Date End Date Taking? Authorizing Provider  amoxicillin (AMOXIL) 500 MG capsule Take 2 capsules (1,000 mg total) by mouth 2 (two) times daily. Patient not taking: Reported on 05/01/2018 09/12/16   Wallis Bamberg, PA-C  clarithromycin (BIAXIN XL) 500 MG 24 hr tablet Take 2 tablets (1,000 mg total) by mouth daily. Patient not taking: Reported on 05/01/2018 09/12/16   Wallis Bamberg, PA-C  esomeprazole (NEXIUM) 20 MG capsule Take 1 capsule (20 mg total) by mouth daily at 12 noon. Patient not taking: Reported on 05/01/2018 09/11/16   Wallis Bamberg, PA-C  ranitidine (ZANTAC) 150 MG tablet Take 1 tablet (150 mg total) by mouth 2 (two) times daily. Patient not taking: Reported on 05/01/2018 09/11/16   Wallis Bamberg, PA-C    No Known Allergies  There are no active problems to display for this patient.   Past Medical History:  Diagnosis Date  . Bell's palsy     No past surgical history on file.  Social History   Socioeconomic History  . Marital status: Unknown    Spouse name: Not on file  . Number of children: Not on file  . Years of education: Not on file  . Highest education level: Not on file  Occupational History  . Not on file  Social Needs  . Financial resource strain: Not on file  . Food insecurity:    Worry: Not on file    Inability: Not on file  . Transportation needs:    Medical: Not on file    Non-medical: Not on file  Tobacco Use  . Smoking status: Never Smoker  . Smokeless tobacco: Never Used  Substance and Sexual Activity  . Alcohol use: Yes    Comment: 1 per week  . Drug use: No  . Sexual activity: Not on file  Lifestyle  . Physical  activity:    Days per week: Not on file    Minutes per session: Not on file  . Stress: Not on file  Relationships  . Social connections:    Talks on phone: Not on file    Gets together: Not on file    Attends religious service: Not on file    Active member of club or organization: Not on file    Attends meetings of clubs or organizations: Not on file    Relationship status: Not on file  . Intimate partner violence:    Fear of current or ex partner: Not on file    Emotionally abused: Not on file    Physically abused: Not on file    Forced sexual activity: Not on file  Other Topics Concern  . Not on file  Social History Narrative  . Not on file    No family history on file.   Review of Systems  Constitutional: Negative.  Negative for chills and fever.  Respiratory: Negative for cough and shortness of breath.   Cardiovascular: Negative for chest pain and palpitations.  Gastrointestinal: Negative for abdominal pain, nausea and vomiting.  Neurological: Negative for dizziness and headaches.  All other systems reviewed and are negative.   Vitals:  05/01/18 1019  BP: 123/83  Pulse: 75  Resp: 16  Temp: 98.3 F (36.8 C)  SpO2: 98%    Physical Exam Vitals signs reviewed.  Constitutional:      Appearance: Normal appearance.  HENT:     Head: Normocephalic and atraumatic.  Eyes:     Extraocular Movements: Extraocular movements intact.     Pupils: Pupils are equal, round, and reactive to light.  Neck:     Musculoskeletal: Normal range of motion.  Cardiovascular:     Rate and Rhythm: Normal rate and regular rhythm.     Heart sounds: Normal heart sounds.  Pulmonary:     Effort: Pulmonary effort is normal.     Breath sounds: Normal breath sounds.  Musculoskeletal: Normal range of motion.  Skin:    General: Skin is warm and dry.     Capillary Refill: Capillary refill takes less than 2 seconds.     Comments: Left big toe: Positive onychomycosis.  See picture below.    Neurological:     General: No focal deficit present.     Mental Status: He is alert and oriented to person, place, and time.  Psychiatric:        Mood and Affect: Mood normal.        Behavior: Behavior normal.        ASSESSMENT & PLAN: Christian Collins was seen today for toe pain.  Diagnoses and all orders for this visit:  Onychomycosis of left great toe -     terbinafine (LAMISIL) 250 MG tablet; Take 1 tablet (250 mg total) by mouth daily.  Need for prophylactic vaccination and inoculation against influenza -     Flu Vaccine QUAD 36+ mos IM  Hemorrhoids, unspecified hemorrhoid type -     hydrocortisone (ANUSOL-HC) 2.5 % rectal cream; Place 1 application rectally 2 (two) times daily.    Patient Instructions       If you have lab work done today you will be contacted with your lab results within the next 2 weeks.  If you have not heard from us then please contact us. The fastest way to get your results is to register for My Chart.   IF you received an x-ray today, you will [facility] an [facility]. Please contact [facility] at [phone] with questions or concerns regarding your invoice.   IF you received labwork today, you will receive an invoice from [facility]. Please contact [facility] at [phone] with questions or concerns regarding your invoice.   Our billing staff will not be able to assist you with questions regarding bills from these companies.  You will be contacted with the lab results as soon as they are available. The fastest way to get your results is to activate your My Chart account. Instructions are located on the last page of this paperwork. If you have not heard from us regarding the results in 2 weeks, please contact this office.     Infeccin por hongos en las uas Fungal Nail Infection La infeccin por hongos en las uas es una infeccin frecuente de las uas de los pies o de las manos. Este trastorno Coca Col[employer]fecta las uas de  los pies con ms frecuencia que las uas de las manos. Generalmente afecta al dedo gordo del pie. Ms de Neomia Dearuna ua puede infectarse. Esta afeccin puede transmitirse de [place] persona a otra (es contagiosa). Cules son las causas? La causa de esta afeccin es un hongo. Son varios los tipos de hongos que pueden causar  la infeccin. Estos hongos son frecuentes en las zonas hmedas y clidas. Si las manos o los pies entran en contacto con los hongos, se pueden introducir en una ruptura de las uas de las manos o de los pies y Games developer infeccin. Qu incrementa el riesgo? Los siguientes factores pueden hacer que usted sea ms propenso a Aeronautical engineer afeccin:  Ser hombre.  Ser Neomia Dear persona de edad avanzada.  Convivir con alguien que tiene hongos.  Caminar descalzo en zonas donde proliferan hongos, como duchas o vestuarios.  Usar zapatos y calcetines que [employer].  Tener una ua lastimada o haberse sometido a una ciruga de uas recientemente.  Tener ciertas afecciones, por ejemplo: ? Pie de atleta. ? Diabetes. ? Psoriasis. ? Mala circulacin. ? Debilitamiento del sistema de defensa del organismo (sistema inmunitario). Cules son los signos o los sntomas? Los sntomas de esta afeccin incluyen:  Una mancha plida sobre la ua.  Engrosamiento de la ua.  Una ua que se torna amarilla o [place].  Bordes de las uas rugosos o quebradizos.  Una ua que se cae.  Una ua que se ha desprendido del lecho ungueal. Cmo se diagnostica? Esta afeccin se diagnostica mediante un examen fsico. El mdico podr tomar una muestra de la ua para examinarla y Engineer, manufacturing si tiene hongos. Cmo se trata? No es necesario realizar tratamiento si la infeccin es leve. Si tiene [employer] uas, el tratamiento puede incluir lo siguiente:  Antimicticos que se toman por boca (va oral). Deber tomar los medicamentos durante algunas semanas o meses y no ver los resultados  hasta despus de un [place]. Estos medicamentos pueden tener efectos secundarios. Consulte al [employer] a los que debe estar atento.  Cremas o esmaltes de uas antimicticos. Se pueden usar junto con los medicamentos antimicticos que se administran por va oral.  Tratamiento lser de las uas.  Ciruga para extirpar la ua. Esto puede ser [employer] casos ms graves de infecciones. La infeccin puede tardar un largo tiempo en desaparecer, habitualmente hasta un ao. Adems, la infeccin puede regresar. Siga estas indicaciones en su casa: Medicamentos  Tome o aplquese los medicamentos de venta libre y los recetados solamente como se lo haya indicado el mdico.  Consulte al mdico sobre el uso de pomadas mentoladas para las uas de [place]. Cuidado de las uas  Crtese las uas con Psychologist, clinical.  Lvese y squese las manos y los pies todos [place].  Mantenga los pies secos: ? Use calcetines absorbentes y cmbiese los calcetines con frecuencia. ? Use un tipo de calzado que permita que el aire [place], como sandalias o zapatillas de lona. Deseche los zapatos viejos.  No use uas artificiales.  Si va al saln de esttica de uas, asegrese de elegir uno en el que se usen instrumentos limpios.  Aplquese polvo antimictico en los pies y en los zapatos. Indicaciones generales  No comparta elementos personales como toallas o cortauas.  No camine descalzo en duchas o vestuarios.  Use guantes de goma si est trabajando con sus manos en lugares mojados.  Concurra a todas las visitas de control como se lo haya indicado el mdico. Esto es importante. Comunquese con un mdico si: La infeccin no mejora o si empeora despus de varios meses. Resumen  La infeccin por hongos en las uas es una infeccin frecuente de las uas de los pies o de las manos.  No es necesario realizar tratamiento si la infeccin  es leve. Si tiene Charter Communicationscambios importantes en las uas,  el tratamiento puede incluir la administracin de medicamentos por va oral y la aplicacin de un medicamento en las uas.  La infeccin puede tardar un largo tiempo en desaparecer, habitualmente hasta un ao. Adems, la infeccin puede regresar.  Tome o aplquese los medicamentos de venta libre y los recetados solamente como se lo haya indicado el mdico.  Siga las instrucciones de cuidado de las uas a fin de ayudar a Automotive engineerevitar que la infeccin regrese o se extienda. Esta informacin no tiene Theme park managercomo fin reemplazar el consejo del mdico. Asegrese de hacerle al mdico cualquier pregunta que tenga. Document Released: 02/06/2005 Document Revised: 11/06/2017 Document Reviewed: 11/06/2017 Elsevier Interactive Patient Education  2019 Elsevier Inc.      Edwina BarthMiguel Tanaia Hawkey, MD [facility]

## 2018-11-09 ENCOUNTER — Encounter: Payer: Self-pay | Admitting: Emergency Medicine

## 2018-11-09 ENCOUNTER — Ambulatory Visit (INDEPENDENT_AMBULATORY_CARE_PROVIDER_SITE_OTHER): Payer: BC Managed Care – PPO | Admitting: Emergency Medicine

## 2018-11-09 ENCOUNTER — Other Ambulatory Visit: Payer: Self-pay

## 2018-11-09 VITALS — BP 149/77 | HR 58 | Temp 98.8°F | Resp 16 | Ht 62.0 in | Wt 189.4 lb

## 2018-11-09 DIAGNOSIS — W57XXXA Bitten or stung by nonvenomous insect and other nonvenomous arthropods, initial encounter: Secondary | ICD-10-CM

## 2018-11-09 DIAGNOSIS — B351 Tinea unguium: Secondary | ICD-10-CM

## 2018-11-09 DIAGNOSIS — L089 Local infection of the skin and subcutaneous tissue, unspecified: Secondary | ICD-10-CM | POA: Diagnosis not present

## 2018-11-09 DIAGNOSIS — S70361A Insect bite (nonvenomous), right thigh, initial encounter: Secondary | ICD-10-CM | POA: Diagnosis not present

## 2018-11-09 DIAGNOSIS — B9689 Other specified bacterial agents as the cause of diseases classified elsewhere: Secondary | ICD-10-CM

## 2018-11-09 MED ORDER — AZITHROMYCIN 250 MG PO TABS: ORAL_TABLET | ORAL | 0 refills | Status: DC

## 2018-11-09 MED ORDER — TERBINAFINE HCL 250 MG PO TABS: 250.0000 mg | ORAL_TABLET | Freq: Every day | ORAL | 1 refills | Status: AC

## 2018-11-09 NOTE — Patient Instructions (Addendum)
If you have lab work done today you will be contacted with your lab results within the next 2 weeks.  If you have not heard from [place] then please contact us. The fastest way to get your results is to register for My Chart.   IF you received an x-ray today, you will receive an invoice from [facility]. Please contact [facility] at [phone] with questions or concerns regarding your invoice.   IF you received labwork today, you will receive an invoice from [facility]. Please contact [facility] at [phone] with questions or concerns regarding your invoice.   Our billing staff will not be able to assist you with questions regarding bills from these companies.  You will be contacted with the lab results as soon as they are available. The fastest way to get your results is to activate your My Chart account. Instructions are located on the last page of this paperwork. If you have not heard from [place] regarding the results in 2 weeks, please contact this office.     Celulitis, en adultos Cellulitis, Adult  La celulitis es una infeccin de la piel. La zona infectada por lo general est caliente, de color rojo, hinchada y duele. Ocurre con ms frecuencia en los brazos y en la parte inferior de las piernas. Es importante realizar un tratamiento para Copy. Cules son las causas? Esta afeccin est causada por bacterias. Las bacterias ingresan a travs de una lesin cutnea, por ejemplo, un corte, una [place], [place] de [place], [place] llaga abierta o una grieta. Qu incrementa el riesgo? Es ms probable que Dietitian en personas que:  Tienen debilitado el sistema de defensa del organismo (sistema inmunitario).  Tienen heridas abiertas, quemaduras, picaduras o rasguos en la piel.  Son [place] de [address] de edad.  Tienen un problema de azcar en la sangre (diabetes).  Tienen una enfermedad heptica de larga duracin (crnica) o enfermedad  renal (cirrosis).  Tienen mucho sobrepeso (es obeso).  Tienen un problema de la piel, como: ? Urticaria que pica (eczema). ? Movimiento lento de Risk manager en las venas (estasis venosa). ? Acumulacin de lquido debajo de la piel (edema).  Han recibido tratamiento con rayos de alta energa (radiacin).  Consumen drogas por va intravenosa. Cules son los signos o los sntomas? Los sntomas de esta afeccin incluyen los siguientes:  Piel que est: ? Enrojecida. ? Veteada. ? Manchada. ? Hinchada. ? Adolorida o duele al tocarla. ? Calor.  Grant Ruts.  Escalofros.  Ampollas. Cmo se diagnostica? Esta afeccin se diagnostica en funcin de lo siguiente:  Antecedentes mdicos.  Examen fsico.  Anlisis de sangre.  Estudios de diagnstico por imgenes. Cmo se trata? El tratamiento de esta afeccin puede incluir lo siguiente:  Medicamentos para tratar las infecciones o Environmental consultant.  Cuidado en el hogar, como por ejemplo: ? Reposo. ? Colocacin de paos fros o tibios (compresas) sobre la piel.  La hospitalizacin, si la afeccin es muy grave. Siga estas indicaciones en su casa: Medicamentos  [employer] de venta libre y los recetados solamente como se lo haya indicado el mdico.  Si le recetaron un antibitico, tmelo como se lo haya indicado el mdico. No deje de tomarlo aunque comience a sentirse mejor. Indicaciones generales   Beba suficiente lquido para mantener la orina de color amarillo plido.  No toque ni frote la zona infectada.  Cuando est sentado o acostado, levante (eleve) la zona infectada por encima del nivel del corazn.  Coloque paos fros o tibios en la zona como se lo haya indicado el mdico.  Concurra a todas las visitas de seguimiento como se lo haya indicado el mdico. Esto es importante. Comunquese con un mdico si:  Tiene fiebre.  No comienza a mejorar luego de 1 o 2 das de [place].  El hueso o la articulacin que  se encuentran debajo de la zona infectada empiezan a dolerle despus de que la piel se cura.  La infeccin regresa. Esto puede ocurrir en la misma zona o en otra.  Tiene una protuberancia hinchado en la zona.  Aparecen nuevos sntomas.  Se siente enfermo y tiene molestias y dolores musculares. Solicite ayuda inmediatamente si:  Sus sntomas empeoran.  Se siente muy somnoliento.  Devuelve (vomita) o tiene deposiciones acuosas (diarrea).  Nota unas lneas rojas en la piel que salen de la zona.  La zona de color rojo se agranda.  La zona roja se vuelve oscura. Estos sntomas pueden representar un problema grave que constituye Engineer, maintenance (IT). No espere a ver si los sntomas desaparecen. Solicite atencin mdica de inmediato. Comunquese con el servicio de emergencias de su localidad (911 en los [place]). No conduzca por sus propios medios [employer]. Resumen  La celulitis es una infeccin de la piel. La zona a menudo se pone caliente, roja, hinchada y duele.  Esta afeccin se trata con medicamentos, reposo, y paos fros y calientes.  Tome todos los medicamentos solamente como se lo haya indicado el mdico.  Informe al mdico si los sntomas no mejoran despus de 1 o 2das de [place]. Esta informacin no tiene Marine scientist el consejo del mdico. Asegrese de hacerle al mdico cualquier pregunta que tenga. Document Released: 10/17/2009 Document Revised: 11/04/2017 Document Reviewed: 11/04/2017 Elsevier Patient Education  2020 [place].  Infeccin por hongos en las uas Fungal Nail Infection La infeccin por hongos en las uas es una infeccin frecuente de las uas de los pies o de las manos. Este trastorno [employer] uas de los pies con ms frecuencia que las uas de las manos. Generalmente afecta al dedo gordo del pie. Ms de Ardelia Mems ua puede infectarse. Esta afeccin puede transmitirse de [place] persona a otra (es contagiosa). Cules son las causas? La  causa de esta afeccin es un hongo. Son varios los tipos de hongos que pueden causar la infeccin. Estos hongos son frecuentes en las zonas hmedas y clidas. Si las manos o los pies entran en contacto con los hongos, se pueden introducir en una ruptura de las uas de las manos o de los pies y Immunologist infeccin. Qu incrementa el riesgo? Los siguientes factores pueden hacer que usted sea ms propenso a Public librarian afeccin:  Ser hombre.  Ser Ardelia Mems persona de edad avanzada.  Convivir con alguien que tiene hongos.  Caminar descalzo en zonas donde proliferan hongos, como duchas o vestuarios.  Usar zapatos y calcetines que [employer].  Tener una ua lastimada o haberse sometido a una ciruga de uas recientemente.  Tener ciertas afecciones, por ejemplo: ? Pie de atleta. ? Diabetes. ? Psoriasis. ? Mala circulacin. ? Debilitamiento del sistema de defensa del organismo (sistema inmunitario). Cules son los signos o los sntomas? Los sntomas de esta afeccin incluyen:  Una mancha plida sobre la ua.  Engrosamiento de la ua.  Una ua que se torna amarilla o [place].  [place] uas rugosos o quebradizos.  Una ua que se cae.  Una ua que se ha  desprendido del lecho ungueal. Cmo se diagnostica? Esta afeccin se diagnostica mediante un examen fsico. El mdico podr tomar una muestra de la ua para examinarla y Engineer, manufacturingdetectar si tiene hongos. Cmo se trata? No es necesario realizar tratamiento si la infeccin es leve. Si tiene Charter Communicationscambios importantes en las uas, el tratamiento puede incluir lo siguiente:  Antimicticos que se toman por boca (va oral). Deber tomar los medicamentos durante algunas semanas o meses y no ver los resultados hasta despus de un [place]o tiempo. Estos medicamentos pueden tener efectos secundarios. Consulte al Dow Chemicalmdico sobre los problemas a los que debe estar atento.  Cremas o esmaltes de uas antimicticos. Se pueden usar junto con los  medicamentos antimicticos que se administran por va oral.  Tratamiento lser de las uas.  Ciruga para extirpar la ua. Esto puede ser Foot Lockernecesario en los casos ms graves de infecciones. La infeccin puede tardar un largo tiempo en desaparecer, habitualmente hasta un ao. Adems, la infeccin puede regresar. Siga estas indicaciones en su casa: Medicamentos  Tome o aplquese los medicamentos de venta libre y los recetados solamente como se lo haya indicado el mdico.  Consulte al mdico sobre el uso de pomadas mentoladas para las uas de [place]venta libre. Cuidado de las uas  Crtese las uas con Psychologist, clinicalfrecuencia.  Lvese y squese las manos y los pies todos [place]os das.  Mantenga los pies secos: ? Use calcetines absorbentes y cmbiese los calcetines con frecuencia. ? Use un tipo de calzado que permita que el aire Mendenhallcircule, como sandalias o zapatillas de lona. Deseche los zapatos viejos.  No use uas artificiales.  Si va al saln de esttica de uas, asegrese de elegir uno en el que se usen instrumentos limpios.  Aplquese polvo antimictico en los pies y en los zapatos. Indicaciones generales  No comparta elementos personales como toallas o cortauas.  No camine descalzo en duchas o vestuarios.  Use guantes de goma si est trabajando con sus manos en lugares mojados.  Concurra a todas las visitas de control como se lo haya indicado el mdico. Esto es importante. Comunquese con un mdico si: La infeccin no mejora o si empeora despus de varios meses. Resumen  La infeccin por hongos en las uas es una infeccin frecuente de las uas de los pies o de las manos.  No es necesario realizar tratamiento si la infeccin es leve. Si tiene Charter Communicationscambios importantes en las uas, el tratamiento puede incluir la administracin de medicamentos por va oral y la aplicacin de un medicamento en las uas.  La infeccin puede tardar un largo tiempo en desaparecer, habitualmente hasta un ao. Adems, la  infeccin puede regresar.  Tome o aplquese los medicamentos de venta libre y los recetados solamente como se lo haya indicado el mdico.  Siga las instrucciones de cuidado de las uas a fin de ayudar a Automotive engineerevitar que la infeccin regrese o se extienda. Esta informacin no tiene Theme park managercomo fin reemplazar el consejo del mdico. Asegrese de hacerle al mdico cualquier pregunta que tenga. Document Released: 02/06/2005 Document Revised: 11/06/2017 Document Reviewed: 11/06/2017 Elsevier Patient Education  2020 [employer]

## 2018-11-09 NOTE — Progress Notes (Signed)
Christian Collins 38 y.o.   Chief Complaint  Patient presents with  . Nail Problem    per patient with toenail fungus on big toe of LEFT foot x 3 months and itching    HISTORY OF PRESENT ILLNESS: This is a 38 y.o. male complaining of 2 things: 1.  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  Better but still present.  Will need a second round of antifungal medication. 2.  Insect bite to right posterior thigh with itching for several days. No other significant symptoms.  HPI   Prior to Admission medications   Medication Sig Start Date End Date Taking? Authorizing Provider  clarithromycin (BIAXIN XL) 500 MG 24 hr tablet Take 2 tablets (1,000 mg total) by mouth daily. Patient not taking: Reported on 05/01/2018 09/12/16   Wallis Bamberg, PA-C  esomeprazole (NEXIUM) 20 MG capsule Take 1 capsule (20 mg total) by mouth daily at 12 noon. Patient not taking: Reported on 05/01/2018 09/11/16   Wallis Bamberg, PA-C  hydrocortisone (ANUSOL-HC) 2.5 % rectal cream Place 1 application rectally 2 (two) times daily. Patient not taking: Reported on 11/09/2018 05/01/18   Georgina Quint, MD  ranitidine (ZANTAC) 150 MG tablet Take 1 tablet (150 mg total) by mouth 2 (two) times daily. Patient not taking: Reported on 05/01/2018 09/11/16   Wallis Bamberg, PA-C    No Known Allergies  There are no active problems to display for this patient.   Past Medical History:  Diagnosis Date  . Bell's palsy     History reviewed. No pertinent surgical history.  Social History   Socioeconomic History  . Marital status: Unknown    Spouse name: Not on file  . Number of children: Not on file  . Years of education: Not on file  . Highest education level: Not on file  Occupational History  . Not on file  Social Needs  . Financial resource strain: Not on file  . Food insecurity    Worry: Not on file    Inability: Not on file  . Transportation needs     Medical: Not on file    Non-medical: Not on file  Tobacco Use  . Smoking status: Never Smoker  . Smokeless tobacco: Never Used  Substance and Sexual Activity  . Alcohol use: Yes    Comment: 1 per week  . Drug use: No  . Sexual activity: Not on file  Lifestyle  . Physical activity    Days per week: Not on file    Minutes per session: Not on file  . Stress: Not on file  Relationships  . Social Musician on phone: Not on file    Gets together: Not on file    Attends religious service: Not on file    Active member of club or organization: Not on file    Attends meetings of clubs or organizations: Not on file    Relationship status: Not on file  . Intimate partner violence    Fear of current or ex partner: Not on file    Emotionally abused: Not on file    Physically abused: Not on file    Forced sexual activity: Not on file  Other Topics Concern  . Not on file  Social History Narrative  . Not on file    History reviewed. No pertinent family history.   Review of Systems  Constitutional: Negative.  Negative for chills and  fever.  HENT: Negative.  Negative for congestion and sore throat.   Respiratory: Negative.  Negative for cough and shortness of breath.   Cardiovascular: Negative.  Negative for chest pain and palpitations.  Gastrointestinal: Negative.  Negative for abdominal pain, diarrhea, nausea and vomiting.  Genitourinary: Negative.  Negative for dysuria.  Musculoskeletal: Negative for back pain, myalgias and neck pain.  Skin: Positive for itching and rash.  Neurological: Negative for dizziness and headaches.  Endo/Heme/Allergies: Negative.   All other systems reviewed and are negative.   Vitals:   11/09/18 1557  BP: (!) 149/77  Pulse: (!) 58  Resp: 16  Temp: 98.8 F (37.1 C)  SpO2: 99%    Physical Exam Vitals signs reviewed.  Constitutional:      Appearance: Normal appearance.  HENT:     Head: Normocephalic and atraumatic.     Nose:  Nose normal.  Eyes:     Extraocular Movements: Extraocular movements intact.     Pupils: Pupils are equal, round, and reactive to light.  Cardiovascular:     Rate and Rhythm: Normal rate and regular rhythm.     Pulses: Normal pulses.     Heart sounds: Normal heart sounds.  Pulmonary:     Effort: Pulmonary effort is normal.     Breath sounds: Normal breath sounds.  Musculoskeletal: Normal range of motion.  Skin:    Capillary Refill: Capillary refill takes less than 2 seconds.     Comments: Left big toenail: Fungal infection still present distally but proximal part of the nail much better than 7 months ago. Right posterior thigh: Area of erythema about 5 cm in diameter with 2 central hyperpigmented dots visible. See pictures below.  Neurological:     General: No focal deficit present.     Mental Status: He is alert and oriented to person, place, and time.  Psychiatric:        Mood and Affect: Mood normal.        Behavior: Behavior normal.          ASSESSMENT & PLAN: Christian Collins was seen today for nail problem.  Diagnoses and all orders for this visit:  Onychomycosis of left great toe -     terbinafine (LAMISIL) 250 MG tablet; Take 1 tablet (250 mg total) by mouth daily for 30 days.  Insect bite of right thigh, initial encounter  Localized bacterial skin infection -     azithromycin (ZITHROMAX) 250 MG tablet; Sig as indicated    Patient Instructions       If you have lab work done today you will be contacted with your lab results within the next 2 weeks.  If you have not heard from us then please contact us. The fastest way to get your results is to register for My Chart.   IF you received an x-ray today, you will receive an invoice from [facility]. Please contact [facility] at [phone] with questions or concerns regarding your invoice.   IF you received labwork today, you will receive an invoice from [facility]. Please contact [facility] at  [phone] with questions or concerns regarding your invoice.   Our billing staff will not be able to assist you with questions regarding bills from these companies.  You will be contacted with the lab results as soon as they are available. The fastest way to get your results is to activate your My Chart account. Instructions are located on the last page of this paperwork. If you have not heard from us  regarding the results in 2 weeks, please contact this office.     Celulitis, en adultos Cellulitis, Adult  La celulitis es una infeccin de la piel. La zona infectada por lo general est caliente, de color rojo, hinchada y duele. Ocurre con ms frecuencia en los brazos y en la parte inferior de las piernas. Es importante realizar un tratamiento para Personnel officer. Cules son las causas? Esta afeccin est causada por bacterias. Las bacterias ingresan a travs de una lesin cutnea, por ejemplo, un corte, una [place], [place] de [place], [place] llaga abierta o una grieta. Qu incrementa el riesgo? Es ms probable que Orthoptist en personas que:  Tienen debilitado el sistema de defensa del organismo (sistema inmunitario).  Tienen heridas abiertas, quemaduras, picaduras o rasguos en la piel.  Son Grant de 35 aos de edad.  Tienen un problema de azcar en la sangre (diabetes).  Tienen una enfermedad heptica de larga duracin (crnica) o enfermedad renal (cirrosis).  Tienen mucho sobrepeso (es obeso).  Tienen un problema de la piel, como: ? Urticaria que pica (eczema). ? Movimiento lento de Herbalist en las venas (estasis venosa). ? Acumulacin de lquido debajo de la piel (edema).  Han recibido tratamiento con rayos de alta energa (radiacin).  Consumen drogas por va intravenosa. Cules son los signos o los sntomas? Los sntomas de esta afeccin incluyen los siguientes:  Piel que est: ? Enrojecida. ? Veteada. ? Manchada. ? Hinchada. ?  Adolorida o duele al tocarla. ? Calor.  Cristy Hilts.  Escalofros.  Ampollas. Cmo se diagnostica? Esta afeccin se diagnostica en funcin de lo siguiente:  Antecedentes mdicos.  Examen fsico.  Anlisis de sangre.  Estudios de diagnstico por imgenes. Cmo se trata? El tratamiento de esta afeccin puede incluir lo siguiente:  Medicamentos para tratar las infecciones o Set designer.  Cuidado en el hogar, como por ejemplo: ? Reposo. ? Colocacin de paos fros o tibios (compresas) sobre la piel.  La hospitalizacin, si la afeccin es muy grave. Siga estas indicaciones en su casa: Medicamentos  [employer] de venta libre y los recetados solamente como se lo haya indicado el mdico.  Si le recetaron un antibitico, tmelo como se lo haya indicado el mdico. No deje de tomarlo aunque comience a sentirse mejor. Indicaciones generales   Beba suficiente lquido para mantener la orina de color amarillo plido.  No toque ni frote la zona infectada.  Cuando est sentado o acostado, levante (eleve) la zona infectada por encima del nivel del corazn.  Coloque paos fros o tibios en la zona como se lo haya indicado el mdico.  Concurra a todas las visitas de seguimiento como se lo haya indicado el mdico. Esto es importante. Comunquese con un mdico si:  Tiene fiebre.  No comienza a mejorar luego de 1 o 2 das de [place].  El hueso o la articulacin que se encuentran debajo de la zona infectada empiezan a dolerle despus de que la piel se cura.  La infeccin regresa. Esto puede ocurrir en la misma zona o en otra.  Tiene una protuberancia hinchado en la zona.  Aparecen nuevos sntomas.  Se siente enfermo y tiene molestias y dolores musculares. Solicite ayuda inmediatamente si:  Sus sntomas empeoran.  Se siente muy somnoliento.  Devuelve (vomita) o tiene deposiciones acuosas (diarrea).  Nota unas lneas rojas en la piel que salen de la zona.  La  zona de color rojo se agranda.  La zona roja se vuelve oscura. Estos sntomas pueden representar  un problema grave que constituye Radio broadcast assistantuna emergencia. No espere a ver si los sntomas desaparecen. Solicite atencin mdica de inmediato. Comunquese con el servicio de emergencias de su localidad (911 en los [place]). No conduzca por sus propios medios OfficeMax Incorporatedhasta el hospital. Resumen  La celulitis es una infeccin de la piel. La zona a menudo se pone caliente, roja, hinchada y duele.  Esta afeccin se trata con medicamentos, reposo, y paos fros y calientes.  Tome todos los medicamentos solamente como se lo haya indicado el mdico.  Informe al mdico si los sntomas no mejoran despus de 1 o 2das de Needvilletratamiento. Esta informacin no tiene Theme park managercomo fin reemplazar el consejo del mdico. Asegrese de hacerle al mdico cualquier pregunta que tenga. Document Released: 10/17/2009 Document Revised: 11/04/2017 Document Reviewed: 11/04/2017 Elsevier Patient Education  2020 Elsevier Inc.  Infeccin por hongos en las uas Fungal Nail Infection La infeccin por hongos en las uas es una infeccin frecuente de las uas de los pies o de las manos. Este trastorno Coca Colaafecta las uas de los pies con ms frecuencia que las uas de las manos. Generalmente afecta al dedo gordo del pie. Ms de Neomia Dearuna ua puede infectarse. Esta afeccin puede transmitirse de [place] persona a otra (es contagiosa). Cules son las causas? La causa de esta afeccin es un hongo. Son varios los tipos de hongos que pueden causar la infeccin. Estos hongos son frecuentes en las zonas hmedas y clidas. Si las manos o los pies entran en contacto con los hongos, se pueden introducir en una ruptura de las uas de las manos o de los pies y Games developerproducir la infeccin. Qu incrementa el riesgo? Los siguientes factores pueden hacer que usted sea ms propenso a Aeronautical engineertener esta afeccin:  Ser hombre.  Ser Neomia Dearuna persona de edad avanzada.  Convivir con alguien que tiene  hongos.  Caminar descalzo en zonas donde proliferan hongos, como duchas o vestuarios.  Usar zapatos y calcetines que Visteon Corporationhacen transpirar los pies.  Tener una ua lastimada o haberse sometido a una ciruga de uas recientemente.  Tener ciertas afecciones, por ejemplo: ? Pie de atleta. ? Diabetes. ? Psoriasis. ? Mala circulacin. ? Debilitamiento del sistema de defensa del organismo (sistema inmunitario). Cules son los signos o los sntomas? Los sntomas de esta afeccin incluyen:  Una mancha plida sobre la ua.  Engrosamiento de la ua.  Una ua que se torna amarilla o [place].  Bordes de las uas rugosos o quebradizos.  Una ua que se cae.  Una ua que se ha desprendido del lecho ungueal. Cmo se diagnostica? Esta afeccin se diagnostica mediante un examen fsico. El mdico podr tomar una muestra de la ua para examinarla y Engineer, manufacturingdetectar si tiene hongos. Cmo se trata? No es necesario realizar tratamiento si la infeccin es leve. Si tiene Charter Communicationscambios importantes en las uas, el tratamiento puede incluir lo siguiente:  Antimicticos que se toman por boca (va oral). Deber tomar los medicamentos durante algunas semanas o meses y no ver los resultados hasta despus de un Qulinlargo tiempo. Estos medicamentos pueden tener efectos secundarios. Consulte al Dow Chemicalmdico sobre los problemas a los que debe estar atento.  Cremas o esmaltes de uas antimicticos. Se pueden usar junto con los medicamentos antimicticos que se administran por va oral.  Tratamiento lser de las uas.  Ciruga para extirpar la ua. Esto puede ser Foot Lockernecesario en los casos ms graves de infecciones. La infeccin puede tardar un largo tiempo en desaparecer, habitualmente hasta un ao. Adems, la infeccin puede regresar. Siga estas  indicaciones en su casa: Medicamentos  Tome o aplquese los medicamentos de venta libre y los recetados solamente como se lo haya indicado el mdico.  Consulte al mdico sobre el uso de pomadas  mentoladas para las uas de Crab Orchardventa libre. Cuidado de las uas  Crtese las uas con Psychologist, clinicalfrecuencia.  Lvese y squese las manos y los pies todos Schubertlos das.  Mantenga los pies secos: ? Use calcetines absorbentes y cmbiese los calcetines con frecuencia. ? Use un tipo de calzado que permita que el aire [place]ber[place]villecircule, como sandalias o zapatillas de lona. Deseche los zapatos viejos.  No use uas artificiales.  Si va al saln de esttica de uas, asegrese de elegir uno en el que se usen instrumentos limpios.  Aplquese polvo antimictico en los pies y en los zapatos. Indicaciones generales  No comparta elementos personales como toallas o cortauas.  No camine descalzo en duchas o vestuarios.  Use guantes de goma si est trabajando con sus manos en lugares mojados.  Concurra a todas las visitas de control como se lo haya indicado el mdico. Esto es importante. Comunquese con un mdico si: La infeccin no mejora o si empeora despus de varios meses. Resumen  La infeccin por hongos en las uas es una infeccin frecuente de las uas de los pies o de las manos.  No es necesario realizar tratamiento si la infeccin es leve. Si tiene Charter Communicationscambios importantes en las uas, el tratamiento puede incluir la administracin de medicamentos por va oral y la aplicacin de un medicamento en las uas.  La infeccin puede tardar un largo tiempo en desaparecer, habitualmente hasta un ao. Adems, la infeccin puede regresar.  Tome o aplquese los medicamentos de venta libre y los recetados solamente como se lo haya indicado el mdico.  Siga las instrucciones de cuidado de las uas a fin de ayudar a Automotive engineerevitar que la infeccin regrese o se extienda. Esta informacin no tiene Theme park managercomo fin reemplazar el consejo del mdico. Asegrese de hacerle al mdico cualquier pregunta que tenga. Document Released: 02/06/2005 Document Revised: 11/06/2017 Document Reviewed: 11/06/2017 Elsevier Patient Education  2020 Elsevier Inc.       Edwina BarthMiguel Jala Dundon, MD [facility]

## 2019-02-10 ENCOUNTER — Telehealth (INDEPENDENT_AMBULATORY_CARE_PROVIDER_SITE_OTHER): Payer: BC Managed Care – PPO | Admitting: Emergency Medicine

## 2019-02-10 ENCOUNTER — Encounter: Payer: Self-pay | Admitting: Emergency Medicine

## 2019-02-10 ENCOUNTER — Other Ambulatory Visit: Payer: Self-pay

## 2019-02-10 VITALS — Ht 62.0 in | Wt 192.0 lb

## 2019-02-10 DIAGNOSIS — J029 Acute pharyngitis, unspecified: Secondary | ICD-10-CM

## 2019-02-10 MED ORDER — AZITHROMYCIN 250 MG PO TABS: ORAL_TABLET | ORAL | 0 refills | Status: AC

## 2019-02-10 NOTE — Progress Notes (Signed)
Telemedicine Encounter- SOAP NOTE Established Patient  This telephone encounter was conducted with the patient's (or proxy's) verbal consent via audio telecommunications: yes/no: Yes Patient was instructed to have this encounter in a suitably private space; and to only have persons present to whom they give permission to participate. In addition, patient identity was confirmed by use of name plus two identifiers (DOB and address).  I discussed the limitations, risks, security and privacy concerns of performing an evaluation and management service by telephone and the availability of in person appointments. I also discussed with the patient that there may be a patient responsible charge related to this service. The patient expressed understanding and agreed to proceed.  I spent a total of TIME; 0 MIN TO 60 MIN: 15 minutes talking with the patient or their proxy.  No chief complaint on file. Sore throat  Subjective   Christian Collins is a 38 y.o. male established patient. Telephone visit today complaining of sore throat and painful swallowing for the past 3 days.  Denies fever or chills.  Denies flulike symptoms.  Eating and drinking well.  Denies nausea or vomiting.  Denies any other significant symptoms.  HPI   There are no active problems to display for this patient.   Past Medical History:  Diagnosis Date  . Bell's palsy     Current Outpatient Medications  Medication Sig Dispense Refill  . clarithromycin (BIAXIN XL) 500 MG 24 hr tablet Take 2 tablets (1,000 mg total) by mouth daily. 28 tablet 0  . esomeprazole (NEXIUM) 20 MG capsule Take 1 capsule (20 mg total) by mouth daily at 12 noon. (Patient not taking: Reported on 02/10/2019) 30 capsule 2  . hydrocortisone (ANUSOL-HC) 2.5 % rectal cream Place 1 application rectally 2 (two) times daily. (Patient not taking: Reported on 02/10/2019) 30 g 1  . ranitidine (ZANTAC) 150 MG tablet Take 1 tablet (150 mg total) by mouth 2 (two)  times daily. (Patient not taking: Reported on 02/10/2019) 60 tablet 0   No current facility-administered medications for this visit.     Not on File  Social History   Socioeconomic History  . Marital status: Unknown    Spouse name: Not on file  . Number of children: Not on file  . Years of education: Not on file  . Highest education level: Not on file  Occupational History  . Not on file  Social Needs  . Financial resource strain: Not on file  . Food insecurity    Worry: Not on file    Inability: Not on file  . Transportation needs    Medical: Not on file    Non-medical: Not on file  Tobacco Use  . Smoking status: Never Smoker  . Smokeless tobacco: Never Used  Substance and Sexual Activity  . Alcohol use: Yes    Comment: 1 per week  . Drug use: No  . Sexual activity: Not on file  Lifestyle  . Physical activity    Days per week: Not on file    Minutes per session: Not on file  . Stress: Not on file  Relationships  . Social Herbalist on phone: Not on file    Gets together: Not on file    Attends religious service: Not on file    Active member of club or organization: Not on file    Attends meetings of clubs or organizations: Not on file    Relationship status: Not on file  . Intimate  partner violence    Fear of current or ex partner: Not on file    Emotionally abused: Not on file    Physically abused: Not on file    Forced sexual activity: Not on file  Other Topics Concern  . Not on file  Social History Narrative  . Not on file    Review of Systems  Constitutional: Negative.  Negative for chills and fever.  HENT: Positive for sore throat. Negative for congestion.   Respiratory: Negative.  Negative for cough and shortness of breath.   Cardiovascular: Negative.  Negative for chest pain and palpitations.  Gastrointestinal: Negative.  Negative for abdominal pain, diarrhea, nausea and vomiting.  Genitourinary: Negative.  Negative for dysuria.   Musculoskeletal: Negative.  Negative for myalgias.  Skin: Negative.  Negative for rash.  Neurological: Negative.  Negative for dizziness and headaches.  All other systems reviewed and are negative.   Objective  Alert and oriented x3 in no apparent respiratory distress. Vitals as reported by the patient: Today's Vitals   02/10/19 1533  Weight: 192 lb (87.1 kg)  Height: 5\' 2"  (1.575 m)    There are no diagnoses linked to this encounter. Diagnoses and all orders for this visit:  Acute pharyngitis, unspecified etiology -     azithromycin (ZITHROMAX) 250 MG tablet; Sig as indicated  Sore throat  Rest and push fluids.  Medications as prescribed.  Tylenol as needed as needed. Advised to contact the office if no better or clinical picture changes in the next couple days.   I discussed the assessment and treatment plan with the patient. The patient was provided an opportunity to ask questions and all were answered. The patient agreed with the plan and demonstrated an understanding of the instructions.   The patient was advised to call back or seek an in-person evaluation if the symptoms worsen or if the condition fails to improve as anticipated.  I provided 15 minutes of non-face-to-face time during this encounter.  , MD  [facility] at [facility]

## 2019-02-10 NOTE — Progress Notes (Signed)
Called patient to triage using interpreter [phone]. Patient is complaining of sore throat for 3 days and when he lie down it feels like his throat swells. Patient states he is unable to sleep when it happens.

## 2020-07-01 ENCOUNTER — Encounter (HOSPITAL_COMMUNITY): Payer: Self-pay | Admitting: Emergency Medicine

## 2020-07-01 ENCOUNTER — Emergency Department (HOSPITAL_COMMUNITY)
Admission: EM | Admit: 2020-07-01 | Discharge: 2020-07-01 | Disposition: A | Discharge status: Home / Self Care | Payer: BC Managed Care – PPO | Attending: Emergency Medicine | Admitting: Emergency Medicine

## 2020-07-01 ENCOUNTER — Other Ambulatory Visit: Payer: Self-pay

## 2020-07-01 DIAGNOSIS — T1501XA Foreign body in cornea, right eye, initial encounter: Secondary | ICD-10-CM | POA: Diagnosis not present

## 2020-07-01 DIAGNOSIS — T1591XA Foreign body on external eye, part unspecified, right eye, initial encounter: Secondary | ICD-10-CM

## 2020-07-01 DIAGNOSIS — X58XXXA Exposure to other specified factors, initial encounter: Secondary | ICD-10-CM | POA: Diagnosis not present

## 2020-07-01 MED ORDER — MOXIFLOXACIN HCL 0.5 % OP SOLN: 1.0000 [drp] | Freq: Four times a day (QID) | OPHTHALMIC | 0 refills | Status: AC

## 2020-07-01 MED ORDER — TETRACAINE HCL 0.5 % OP SOLN
2.0000 [drp] | Freq: Once | OPHTHALMIC | Status: AC
  Administered 2020-07-01: 2 [drp] via OPHTHALMIC
  Filled 2020-07-01: qty 4

## 2020-07-01 MED ORDER — FLUORESCEIN SODIUM 1 MG OP STRP
1.0000 | ORAL_STRIP | Freq: Once | OPHTHALMIC | Status: AC
  Administered 2020-07-01: 1 via OPHTHALMIC
  Filled 2020-07-01: qty 1

## 2020-07-01 NOTE — ED Provider Notes (Signed)
[facility]-EMERGENCY DEPT Provider Note   CSN: [id number] Arrival date & time: 07/01/20  1538     History Chief Complaint  Patient presents with  . Eye Injury    Christian Collins is a 40 y.o. male.  HPI 40 year old male who presents to the ER with complaints of foreign body sensation in his right eye.  Patient states that he was at home yesterday evening, walked outside, walked inside and sat down at the TV, and started to feel a foreign body sensation.  He has had tearing and redness to the eye as well as pain.  He does not endorse decreased vision in that eye.  He is not exactly sure what could have flown into his eye, though states it may be glass.  He denies any glass breakage near him recently, does not work with glass, has not done any welding.  He does not wear contact lenses or glasses    Past Medical History:  Diagnosis Date  . Bell's palsy     There are no problems to display for this patient.   History reviewed. No pertinent surgical history.     No family history on file.  Social History   Tobacco Use  . Smoking status: Never Smoker  . Smokeless tobacco: Never Used  Substance Use Topics  . Alcohol use: Yes    Comment: 1 per week  . Drug use: No    Home Medications Prior to Admission medications   Medication Sig Start Date End Date Taking? Authorizing Provider  moxifloxacin (VIGAMOX) 0.5 % ophthalmic solution Place 1 drop into the right eye 4 (four) times daily for 4 days. 07/01/20 07/05/20 Yes Mare Ferrari, PA-C  azithromycin (ZITHROMAX) 250 MG tablet Sig as indicated 02/10/19   Georgina Quint, MD  clarithromycin (BIAXIN XL) 500 MG 24 hr tablet Take 2 tablets (1,000 mg total) by mouth daily. 09/12/16   Wallis Bamberg, PA-C  esomeprazole (NEXIUM) 20 MG capsule Take 1 capsule (20 mg total) by mouth daily at 12 noon. Patient not taking: Reported on 02/10/2019 09/11/16   Wallis Bamberg, PA-C  hydrocortisone (ANUSOL-HC) 2.5 % rectal cream  Place 1 application rectally 2 (two) times daily. Patient not taking: Reported on 02/10/2019 05/01/18   Georgina Quint, MD  ranitidine (ZANTAC) 150 MG tablet Take 1 tablet (150 mg total) by mouth 2 (two) times daily. Patient not taking: Reported on 02/10/2019 09/11/16   Wallis Bamberg, PA-C    Allergies    Patient has no known allergies.  Review of Systems   Review of Systems  Constitutional: Negative for chills and fever.  Eyes: Positive for photophobia, pain, redness and visual disturbance.    Physical Exam Updated Vital Signs BP (!) 156/93 (BP Location: Left Arm)   Pulse 64   Temp 98.7 F (37.1 C) (Oral)   Resp 16   SpO2 100%   Physical Exam Vitals reviewed.  Constitutional:      Appearance: Normal appearance.  HENT:     Head: Normocephalic and atraumatic.  Eyes:     General: No scleral icterus.       Right eye: No discharge.        Left eye: No discharge.     Extraocular Movements: Extraocular movements intact.     Conjunctiva/sclera: Conjunctivae normal.     Pupils: Pupils are equal, round, and reactive to light.      Comments: Pupils equal and reactive, right eye with erythematous sclera, tearing.  Fluorescein stain  with Woods lamp does show uptake at about the 5 o'clock position of the iris.  There is a small speck, not removable with the cotton swab.  Visual acuity performed by nursing staff with 20/100 in the right eye.  No evidence of vitreous hemorrhage, hyphema  Musculoskeletal:        General: No swelling. Normal range of motion.  Neurological:     General: No focal deficit present.     Mental Status: He is alert and oriented to person, place, and time.  Psychiatric:        Mood and Affect: Mood normal.        Behavior: Behavior normal.     ED Results / Procedures / Treatments   Labs (all labs ordered are listed, but only abnormal results are displayed) Labs Reviewed - No data to display  EKG None  Radiology No results  found.  Procedures Procedures   Medications Ordered in ED Medications  tetracaine (PONTOCAINE) 0.5 % ophthalmic solution 2 drop (2 drops Right Eye Given 07/01/20 1804)  fluorescein ophthalmic strip 1 strip (1 strip Right Eye Given 07/01/20 1805)    ED Course  I have reviewed the triage vital signs and the nursing notes.  Pertinent labs & imaging results that were available during my care of the patient were reviewed by me and considered in my medical decision making (see chart for details).    MDM Rules/Calculators/A&P                          40 year old male presents with a foreign body sensation to his right eye.  Unclear of what this foreign body is.  Physical exam does have uptake at around 5 o'clock position in the iris.  Unable to remove the foreign body.  I did speak with Dr. Allyne Gee with ophthalmology, who recommends Vigamox 4 times a day x3 days, following up in office on Monday.  Patient is not a contact lens wearer and does not wear glasses.  I relayed this information to the patient, who verbalizes understanding, and is agreeable.  Return precautions discussed.  Contact information for Dr. Kellie Moor office provided.  At this stage in ED course, the patient's medical screening stable for discharge.  This was a shared visit with my supervising physician Dr. Madilyn Hook who independently saw and evaluated the patient & provided guidance in evaluation/management/disposition ,in agreement with care  Final Clinical Impression(s) / ED Diagnoses Final diagnoses:  Foreign body of right eye, initial encounter    Rx / DC Orders ED Discharge Orders         Ordered    moxifloxacin (VIGAMOX) 0.5 % ophthalmic solution  4 times daily        07/01/20 1919           Leone Brand 07/01/20 1945    Tilden Fossa, MD 07/01/20 [phone]

## 2020-07-01 NOTE — ED Triage Notes (Signed)
Patient reports "something in eye" since last night. Difficulty opening right eye.

## 2020-07-01 NOTE — Discharge Instructions (Addendum)
Please take the antibiotic drops as directed.  Please make sure to follow-up with Dr. Allyne Gee in his office on Monday.  Return to the ER for any new or worsening symptoms.
